# Patient Record
Sex: Female | Born: 1985 | Race: White | Hispanic: No | Smoking: Former smoker
Health system: Southern US, Community
[De-identification: ages and names within clinical notes are randomized; demographics above are authoritative.]

## PROBLEM LIST (undated history)

## (undated) DIAGNOSIS — F329 Major depressive disorder, single episode, unspecified: Secondary | ICD-10-CM

## (undated) DIAGNOSIS — R0609 Other forms of dyspnea: Secondary | ICD-10-CM

## (undated) DIAGNOSIS — Z973 Presence of spectacles and contact lenses: Secondary | ICD-10-CM

## (undated) DIAGNOSIS — O24419 Gestational diabetes mellitus in pregnancy, unspecified control: Secondary | ICD-10-CM

## (undated) DIAGNOSIS — F319 Bipolar disorder, unspecified: Secondary | ICD-10-CM

## (undated) DIAGNOSIS — I441 Atrioventricular block, second degree: Secondary | ICD-10-CM

## (undated) DIAGNOSIS — Z8632 Personal history of gestational diabetes: Secondary | ICD-10-CM

## (undated) DIAGNOSIS — R000 Tachycardia, unspecified: Secondary | ICD-10-CM

## (undated) DIAGNOSIS — Z9889 Other specified postprocedural states: Secondary | ICD-10-CM

## (undated) DIAGNOSIS — D649 Anemia, unspecified: Secondary | ICD-10-CM

## (undated) DIAGNOSIS — N3941 Urge incontinence: Secondary | ICD-10-CM

## (undated) DIAGNOSIS — I10 Essential (primary) hypertension: Secondary | ICD-10-CM

## (undated) DIAGNOSIS — Z972 Presence of dental prosthetic device (complete) (partial): Secondary | ICD-10-CM

## (undated) DIAGNOSIS — N83209 Unspecified ovarian cyst, unspecified side: Secondary | ICD-10-CM

## (undated) DIAGNOSIS — R55 Syncope and collapse: Secondary | ICD-10-CM

## (undated) DIAGNOSIS — F32A Depression, unspecified: Secondary | ICD-10-CM

## (undated) DIAGNOSIS — A630 Anogenital (venereal) warts: Secondary | ICD-10-CM

## (undated) DIAGNOSIS — N189 Chronic kidney disease, unspecified: Secondary | ICD-10-CM

## (undated) DIAGNOSIS — R112 Nausea with vomiting, unspecified: Secondary | ICD-10-CM

## (undated) DIAGNOSIS — F419 Anxiety disorder, unspecified: Secondary | ICD-10-CM

## (undated) DIAGNOSIS — Z8759 Personal history of other complications of pregnancy, childbirth and the puerperium: Secondary | ICD-10-CM

## (undated) DIAGNOSIS — Z87448 Personal history of other diseases of urinary system: Secondary | ICD-10-CM

## (undated) DIAGNOSIS — D500 Iron deficiency anemia secondary to blood loss (chronic): Secondary | ICD-10-CM

## (undated) DIAGNOSIS — I4729 Other ventricular tachycardia: Secondary | ICD-10-CM

## (undated) DIAGNOSIS — N920 Excessive and frequent menstruation with regular cycle: Secondary | ICD-10-CM

## (undated) DIAGNOSIS — F99 Mental disorder, not otherwise specified: Secondary | ICD-10-CM

## (undated) DIAGNOSIS — M1990 Unspecified osteoarthritis, unspecified site: Secondary | ICD-10-CM

## (undated) HISTORY — DX: Essential (primary) hypertension: I10

## (undated) HISTORY — DX: Unspecified osteoarthritis, unspecified site: M19.90

## (undated) HISTORY — PX: WRIST SURGERY: SHX841

## (undated) HISTORY — DX: Anemia, unspecified: D64.9

---
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
## 2004-03-13 ENCOUNTER — Other Ambulatory Visit: Admission: RE | Admit: 2004-03-13 | Discharge: 2004-03-13 | Payer: Self-pay | Admitting: Obstetrics and Gynecology

## 2007-08-03 ENCOUNTER — Emergency Department (HOSPITAL_COMMUNITY): Admission: EM | Admit: 2007-08-03 | Discharge: 2007-08-03 | Payer: Self-pay | Admitting: Emergency Medicine

## 2007-08-28 ENCOUNTER — Encounter: Admission: RE | Admit: 2007-08-28 | Discharge: 2007-08-28 | Payer: Self-pay | Admitting: Orthopedic Surgery

## 2009-07-18 ENCOUNTER — Emergency Department (HOSPITAL_BASED_OUTPATIENT_CLINIC_OR_DEPARTMENT_OTHER): Admission: EM | Admit: 2009-07-18 | Discharge: 2009-07-18 | Payer: Self-pay | Admitting: Emergency Medicine

## 2010-03-08 ENCOUNTER — Inpatient Hospital Stay (HOSPITAL_COMMUNITY): Admission: AD | Admit: 2010-03-08 | Discharge: 2010-03-10 | Payer: Self-pay | Admitting: Obstetrics and Gynecology

## 2010-07-04 LAB — CBC
HCT: 30.6 % — ABNORMAL LOW (ref 36.0–46.0)
HCT: 31.8 % — ABNORMAL LOW (ref 36.0–46.0)
Hemoglobin: 10.4 g/dL — ABNORMAL LOW (ref 12.0–15.0)
Hemoglobin: 10.8 g/dL — ABNORMAL LOW (ref 12.0–15.0)
MCH: 30.3 pg (ref 26.0–34.0)
MCHC: 34 g/dL (ref 30.0–36.0)
MCV: 89 fL (ref 78.0–100.0)
Platelets: 169 10*3/uL (ref 150–400)
RBC: 3.57 MIL/uL — ABNORMAL LOW (ref 3.87–5.11)
RDW: 13 % (ref 11.5–15.5)
RDW: 13.2 % (ref 11.5–15.5)
WBC: 12 10*3/uL — ABNORMAL HIGH (ref 4.0–10.5)
WBC: 8.9 10*3/uL (ref 4.0–10.5)

## 2010-07-04 LAB — RPR: RPR Ser Ql: NONREACTIVE

## 2010-07-17 LAB — DIFFERENTIAL
Basophils Absolute: 0 10*3/uL (ref 0.0–0.1)
Basophils Relative: 0 % (ref 0–1)
Eosinophils Absolute: 0 10*3/uL (ref 0.0–0.7)
Eosinophils Relative: 1 % (ref 0–5)
Lymphs Abs: 1.8 10*3/uL (ref 0.7–4.0)
Neutrophils Relative %: 75 % (ref 43–77)

## 2010-07-17 LAB — BASIC METABOLIC PANEL
BUN: 10 mg/dL (ref 6–23)
Chloride: 103 mEq/L (ref 96–112)
Creatinine, Ser: 0.6 mg/dL (ref 0.4–1.2)
Glucose, Bld: 86 mg/dL (ref 70–99)
Potassium: 4 mEq/L (ref 3.5–5.1)

## 2010-07-17 LAB — CBC
HCT: 40.3 % (ref 36.0–46.0)
MCHC: 34.3 g/dL (ref 30.0–36.0)
MCV: 90.2 fL (ref 78.0–100.0)
Platelets: 240 10*3/uL (ref 150–400)
RDW: 11.7 % (ref 11.5–15.5)
WBC: 9.8 10*3/uL (ref 4.0–10.5)

## 2010-07-17 LAB — URINALYSIS, ROUTINE W REFLEX MICROSCOPIC
Glucose, UA: NEGATIVE mg/dL
Nitrite: NEGATIVE
Protein, ur: NEGATIVE mg/dL
pH: 6 (ref 5.0–8.0)

## 2010-07-17 LAB — URINE MICROSCOPIC-ADD ON

## 2010-07-17 LAB — PREGNANCY, URINE: Preg Test, Ur: POSITIVE

## 2010-07-17 LAB — HCG, QUANTITATIVE, PREGNANCY: hCG, Beta Chain, Quant, S: 18515 m[IU]/mL — ABNORMAL HIGH (ref <5)

## 2010-09-08 NOTE — H&P (Signed)
[facility]  Patient:    BELEM, HINTZE A Visit Number: [id number] MRN: [id number]          Service Type: PSY Location: 10 0104 02 Attending Physician:  Veneta Penton Dictated by:   Veneta Penton, M.D. Admit Date:  01/30/2001                     Psychiatric Admission Assessment  DATE OF ADMISSION:  January 30, 2001.  REASON FOR ADMISSION:  This 25 year old white female was admitted complaining of depression with suicidal ideation with a plan to shoot herself.  HISTORY OF PRESENT ILLNESS:  The patient complains of recurrent episodes of major depression since her brother died 6 years ago.  She and her family have never grieved and have never recovered from this.  The patient reports thinking about suicide "all the time."  She complains of a depressed, irritable, and angry mood most of the day nearly every day, anhedonia, decreased school performance, decreased appetite, weight loss, insomnia, nightmares, feelings of hopelessness, helplessness, worthlessness, decreased concentration and energy level, increased symptoms of fatigue, psychomotor retardation, multiple somatic complaints for which she has undergone multiple medical workups which have been negative.  She also reports significant generalized anxiety.  PAST PSYCHIATRIC HISTORY:  Significant for the recurrent episodes of depression as described as well as a possible conduct disorder characterized by destruction of property and an episode of running away.  She has a longstanding history of oppositional and defiant behavior.  She also has a longstanding drug problem.  She reports smoking at least 3 joints per day of cannabis, using at least 2 beers per day, and smoking cigarettes on a regular basis.  Her polysubstance abuse appears to be much more significant however, and she is refusing to discuss it further.  PAST MEDICAL HISTORY:  Negative for medical or surgical  problems.  DRUG ALLERGIES:  She is allergic to HYDROCODONE and SULFA DRUGS, from which she has had a rash in the past.  She has a history of nausea with NAPROSYN.  CURRENT MEDICATIONS:  She is on no current medications.  FAMILY AND SOCIAL HISTORY:  Mother and father are both depressed, and paternal grandmother has a history of depression.  The patient currently resides with her parents and her 32 year old brother.  She works doing Audiological scientist and has been homeschooled because of an inability to be able to manage in a regular school program for the past 2 to 3 years because of her depression.  STRENGTHS AND ASSETS:  Her parents are supportive of her.  MENTAL STATUS EXAMINATION:  The patient presents as well-developed, well- nourished cachectic white female, who is alert, oriented x 4, psychomotor retarded, whose appearance is compatible with her stated age.  She is disheveled and unkempt.  Her speech is coherent with a decreased rate and volume and speech increased speech latency.  She displays no looseness of associations or evidence of a thought disorder.  Her affect and mood are depressed, irritable, and angry.  Her concentration is decreased.  Her immediate recall, short term memory and remote memory are intact.  Her thought processes are generally goal directed, but sometimes become derailed.  ADMISSION DIAGNOSES: Axis I:    1. Major depression, recurrent type, severe, without psychosis.            2. Polysubstance dependence.            3. Rule out anxiety disorder.  4. Rule out conduct disorder. Axis II:   1. Rule out personality disorder not otherwise specified.            2. Rule out learning disorder not otherwise specified. Axis III:  None. Axis IV:   Severe. Axis V:    Code 20.  FURTHER EVALUATION AND TREATMENT RECOMMENDATIONS:  ESTIMATED LENGTH OF STAY ON THE INPATIENT UNIT:  Five to seven days.  INITIAL DISCHARGE PLAN:  To discharge the patient to  home.  INITIAL PLAN OF CARE:  To begin the patient on a trial of Effexor XR.  Ive discussed with the patient and her parents the risks, benefits, side effects and alternatives, including the alternative of using no medication, and they appear to understand and agree to a trial of Effexor.  A laboratory workup will also be initiated to rule out any other medical problems contributing to her symptomatology.  Psychotherapy will focus on decreasing cognitive distortions, decreasing potential for self harm, as well as confronting her chemical dependency issues.Dictated by:   Veneta Penton, M.D. Attending Physician:  Veneta Penton DD:  01/31/01 TD:  01/31/01 Job: [id number]

## 2010-09-08 NOTE — Op Note (Signed)
[facility]  Patient:    Brandy Hansen, Brandy Hansen                  MRN: [id number] Proc. Date: 10/29/00 Adm. Date:  [id number] Attending:  Ronne Binning                           Operative Report  PREOPERATIVE DIAGNOSES:  Internal derangement, questionable TFCC tear, right wrist.  POSTOPERATIVE DIAGNOSES:  Stretch of scapholunate small dorsal cyst, synovitis, TFCC right wrist.  OPERATION:  Arthroscopy, capsular shrinking, scapholunate shrinkage, debridement of synovitis right wrist.  SURGEON:  Nicki Reaper, M.D.  ASSISTANT:  RN  ANESTHESIA:  General.  ANESTHESIOLOGIST:  Judie Petit, M.D.  HISTORY:  The patient is a 25 year old female who suffered a twisting injury to her wrist with dorsal wrist pain.  This has not responded to conservative treatment.  PROCEDURE:  The patient was brought to the operating room where a general anesthetic was carried out without difficulty.  She was prepped and draped using Betadine scrub and solution with the right arm free in supine position. The lumen was placed in the arthroscopy tower, 10 pounds of traction applied, the joint inflated to a 3-4 portal.  A 3-4 portal was then opened transversely through the skin normally, deepened with a hemostat.  One trocar was used to enter the joint.  The joint was fully inspected and significant laxity of the scapholunate ligament was identified.  A very significant dorsal synovitis was present over the scapholunate area.  A synovitis was present on the ulnar aspect.  An irrigation catheter was placed in ______.  A 4-5 portal was then opened after localization with a 22 gauge needle.  An area of chondromalacia was present on the ulnar aspect of the lunate.  A probe was introduced. Triangular fiber cartilage showed a normal trampoline effect.  There was a very minimal tear of the peripheral aspect of the TFCC.  An Arthrowand was then inserted and a shrinkage  performed.  A debridement of the synovitis was then performed with adequate flow and circulation.  The pre styloid recess was filled with synovial tissue with significant hyperemia.  This area was debrided with the Arthrowand.  The scope was then introduced in 4-5, the ulnar aspect inspected.  The area of chondromalacia was minimally debrided.  The ______ joint was intact.  The Arthrowand was inserted through the 3-4 portal again with adequate irrigation.  A shrinkage of the scapholunate ligament complex was then performed.  The dorsal capsule was then inspected.  A small ganglion cyst was present and this was debrided with the Arthrowand.  The synovitis was removed dorsally and radially.  The mid carpal joint was then inspected through the mid carpal 3-4 distal portal.  The irrigation catheter placed in the distal 4-5 portal.  The joint inspected.  The scapholunate was tight.  The lunate ______ was tight.  There were no cartilage lesions on the capitate, hamate, lunate, ______, or scaphoid.  The ______ joint was intact. The instruments were removed.  The joint proximal row was then injected with Depo-Medrol and Marcaine.  The portals closed with interrupted 5-0 nylon sutures.  Sterile compressive dressing and splint was applied.  Patient tolerated the procedure well and was taken to the recovery room in satisfactory condition.  She is discharged home to return to the [facility] in one week on Talwin and Keflex. DD:  10/29/00  TD:  10/29/00 Job: [id number]

## 2010-09-08 NOTE — Discharge Summary (Signed)
[facility]  Patient:    Brandy Hansen, Brandy Hansen Visit Number: [id number] MRN: [id number]          Service Type: PSY Location: 10 0104 02 Attending Physician:  Veneta Penton Dictated by:   Veneta Penton, M.D. Admit Date:  01/30/2001 Discharge Date: 02/04/2001                             Discharge Summary  REASON FOR ADMISSION:  This 25 year old white female was admitted complaining of depression with suicidal ideation with Hansen plan to shoot herself.  For further history of present illness, please see the patients psychiatric admission assessment.  PHYSICAL EXAMINATION:  At the time of admission was significant only for Hansen history of severe nightmares.  Her history is also significant for cannabis dependence and nicotine dependence.  LABORATORY EXAMINATION:  The patient underwent Hansen laboratory work-up to rule out any medical problems contributing to her symptomatology.  Hansen urine drug screen was positive for metabolites of marijuana.  Hansen urine probe for gonorrhea and chlamydia were negative.  Hansen CBC showed Hansen hematocrit of 35.0 and was otherwise unremarkable.  Hansen basic metabolic panel was within normal limits.  Hansen hepatic panel was within normal limits.  Hansen GGT was within normal limits.  TSH and free T4 was within normal limits.  Urine pregnancy test was negative.  Hansen UA was unremarkable.  An RPR was nonreactive.  The patient received no x-rays, no special procedures, no additional consultations.  She sustained no complications during the course of this hospitalization.  HOSPITAL COURSE:  The patient rapidly adapted well to unit routine, socializing well with both patients and staff.  On admission, she was psychomotor retarded, disheveled, unkempt, depressed, irritable with decreased concentration and was very guarded.  She was confronted on her chemical dependency issues, as well as the fact that her brother died 7 years ago and she and her family  have never addressed this loss.  She began actively working on these issues and her affect and mood is improved.  She was begun on Hansen trial of Effexor XR and has tolerated this medication well without side effects.  At the time of discharge, she denies any homicidal or suicidal ideation.  She is actively working on her 12-step issues to address her chemical dependency problems.  She no longer appears to be Hansen danger to herself or others, denies any homicidal or suicidal ideation, and has shown no assaultive or aggressive behavior while hospitalized.  She has actively participated in all aspects of the therapeutic treatment program and consequently is felt to have reached her maximum benefits of hospitalization and is ready for discharge to Hansen less restricted alternative setting.  CONDITION ON DISCHARGE:  Improved  FINAL DIAGNOSIS: Axis I:    1. Major depression, recurrent type, severe, without psychosis.            2. Polysubstance dependence.            3. Rule out anxiety disorder.            4. Rule out conduct disorder. Axis II:   Rule out personality disorder not otherwise specified. Axis III:  None. Axis IV:   Severe. Axis V:    Code 20 on admission, code 30 on discharge.  FURTHER EVALUATION AND TREATMENT RECOMMENDATIONS: 1. The patient is discharged to home. 2. She is discharged on an unrestricted level of activity and Hansen regular diet. 3.  She will follow up with Dr. Carolanne Grumbling at [facility]    for all further aspects of her psychiatric care and consequently I    will sign off on the case at this time.  DISCHARGE MEDICATIONS:  Effexor XR 37.5 mg p.o. q.Hansen.m. with food for 3 days, then increasing to 75 mg p.o. q.Hansen.m. with food. Dictated by:   Veneta Penton, M.D. Attending Physician:  Veneta Penton DD:  02/04/01 TD:  02/04/01 Job: [id number]

## 2011-04-24 DIAGNOSIS — Z8759 Personal history of other complications of pregnancy, childbirth and the puerperium: Secondary | ICD-10-CM

## 2011-04-24 HISTORY — DX: Personal history of other complications of pregnancy, childbirth and the puerperium: Z87.59

## 2011-06-09 ENCOUNTER — Inpatient Hospital Stay (HOSPITAL_BASED_OUTPATIENT_CLINIC_OR_DEPARTMENT_OTHER)
Admission: EM | Admit: 2011-06-09 | Discharge: 2011-06-09 | Disposition: A | Discharge status: Home / Self Care | Payer: Self-pay | Attending: Obstetrics and Gynecology | Admitting: Obstetrics and Gynecology

## 2011-06-09 ENCOUNTER — Inpatient Hospital Stay (HOSPITAL_COMMUNITY): Payer: Self-pay

## 2011-06-09 ENCOUNTER — Encounter (HOSPITAL_BASED_OUTPATIENT_CLINIC_OR_DEPARTMENT_OTHER): Payer: Self-pay | Admitting: *Deleted

## 2011-06-09 DIAGNOSIS — O200 Threatened abortion: Secondary | ICD-10-CM | POA: Insufficient documentation

## 2011-06-09 DIAGNOSIS — O00109 Unspecified tubal pregnancy without intrauterine pregnancy: Secondary | ICD-10-CM | POA: Insufficient documentation

## 2011-06-09 DIAGNOSIS — O2690 Pregnancy related conditions, unspecified, unspecified trimester: Secondary | ICD-10-CM

## 2011-06-09 DIAGNOSIS — Z09 Encounter for follow-up examination after completed treatment for conditions other than malignant neoplasm: Secondary | ICD-10-CM

## 2011-06-09 LAB — URINALYSIS, ROUTINE W REFLEX MICROSCOPIC
Bilirubin Urine: NEGATIVE
Ketones, ur: NEGATIVE mg/dL
Leukocytes, UA: NEGATIVE
Nitrite: NEGATIVE
Protein, ur: NEGATIVE mg/dL
Urobilinogen, UA: 1 mg/dL (ref 0.0–1.0)
pH: 6.5 (ref 5.0–8.0)

## 2011-06-09 LAB — CBC
Hemoglobin: 12.4 g/dL (ref 12.0–15.0)
MCH: 29.8 pg (ref 26.0–34.0)
MCV: 85.8 fL (ref 78.0–100.0)
Platelets: 261 10*3/uL (ref 150–400)
RBC: 4.16 MIL/uL (ref 3.87–5.11)
WBC: 10.8 10*3/uL — ABNORMAL HIGH (ref 4.0–10.5)

## 2011-06-09 LAB — ABO/RH: ABO/RH(D): O POS

## 2011-06-09 LAB — WET PREP, GENITAL
Trich, Wet Prep: NONE SEEN
Yeast Wet Prep HPF POC: NONE SEEN

## 2011-06-09 LAB — URINE MICROSCOPIC-ADD ON

## 2011-06-09 NOTE — ED Provider Notes (Signed)
History    Scribed for Doug Sou, MD, the patient was seen in room MH07/MH07. This chart was scribed by Katha Cabal.   CSN: [id number]  Arrival date & time 06/09/11  1750   First MD Initiated Contact with Patient 06/09/11 1953      Chief Complaint  Patient presents with  . Vaginal Bleeding    (Consider location/radiation/quality/duration/timing/severity/associated sxs/prior treatment) HPI Brandy Hansen is a 26 y.o. female who presents to the Emergency Department complaining of persistent mild vaginal bleeding that began today.  Episode associated with abdominal cramping.  Episode has not changed.  Patient took home pregnancy test and [employer] confirmed pregnancy 3-4 days ago.  Patient is [redacted] weeks pregnant.  Patient reports prior pregnancy without complications.  There are no chronic health problems.  Patient taking prenatal vitamins.  Patient's last menstrual period was 05/01/2011.  History reviewed. No pertinent past medical history.  History reviewed. No pertinent past surgical history.  History reviewed. No pertinent family history.  History  Substance Use Topics  . Smoking status: Former Games developer  . Smokeless tobacco: Not on file  . Alcohol Use: No  Patient quit smoking 4 days ago.  No illicit drug use.  Patient drinks occasionally.    OB History    Grav Para Term Preterm Abortions TAB SAB Ect Mult Living   2 1              Review of Systems  Constitutional: Negative.   HENT: Negative.   Respiratory: Negative.   Cardiovascular: Negative.   Gastrointestinal: Negative.   Genitourinary: Positive for vaginal bleeding.  Musculoskeletal: Negative.   Skin: Negative.   Neurological: Negative.   Hematological: Negative.   Psychiatric/Behavioral: Negative.     Allergies  Codeine; Naproxen; and Sulfa antibiotics  Home Medications   Current Outpatient Rx  Name Route Sig Dispense Refill  . PRENATAL MULTIVITAMIN CH Oral Take 1 tablet by  mouth daily.      BP 136/79  Pulse 96  Temp(Src) 98.3 F (36.8 C) (Oral)  Resp 20  Ht 5\' 6"  (1.676 m)  Wt 131 lb (59.421 kg)  BMI 21.14 kg/m2  SpO2 99%  LMP 05/01/2011  Physical Exam  Nursing note and vitals reviewed. Constitutional: She appears well-developed and well-nourished.  HENT:  Head: Normocephalic and atraumatic.  Eyes: Conjunctivae are normal. Pupils are equal, round, and reactive to light.  Neck: Neck supple. No tracheal deviation present. No thyromegaly present.  Cardiovascular: Normal rate and regular rhythm.   No murmur heard. Pulmonary/Chest: Effort normal and breath sounds normal.  Abdominal: Soft. Bowel sounds are normal. She exhibits no distension. There is no tenderness.  Genitourinary:       Cervical os closed, no external lesion, minimal blood in vaginal vault, right adnexa tenderness no masses, no cervical motion tenderness   Musculoskeletal: Normal range of motion. She exhibits no edema and no tenderness.  Neurological: She is alert. Coordination normal.  Skin: Skin is warm and dry. No rash noted.  Psychiatric: She has a normal mood and affect.    ED Course  Procedures (including critical care time)   DIAGNOSTIC STUDIES: Oxygen Saturation is 99% on room air, normal by my interpretation.     COORDINATION OF CARE: 8:07 Pm  Physical and pelvic exam complete.     LABS / RADIOLOGY:   Labs Reviewed  URINALYSIS, ROUTINE W REFLEX MICROSCOPIC - Abnormal; Notable for the following:    Hgb urine dipstick LARGE (*) VERIFIED   All other  components within normal limits  URINE MICROSCOPIC-ADD ON - Abnormal; Notable for the following:    Bacteria, UA FEW (*)    All other components within normal limits  PREGNANCY, URINE - Abnormal; Notable for the following:    Preg Test, Ur POSITIVE (*)    All other components within normal limits  TYPE AND SCREEN  CBC  GC/CHLAMYDIA PROBE AMP, GENITAL  RPR  WET PREP, GENITAL   No results found.   No diagnosis  found.    MDM  Differential diagnosis includes threatened abortion versus ectopic pregnancy. We do not have ultrasound available to [place] presently Spoke with Dr. Judie Petit. Dareen Piano who will accept patient in transfer to the maternity admissions unit at [facility] hospital Diagnosis complication of pregnancy    I personally performed the services described in this documentation, which was scribed in my presence. The recorded information has been reviewed and considered.    Doug Sou, MD 06/09/11 2014

## 2011-06-09 NOTE — ED Notes (Signed)
Pt states she is [redacted] weeks pregnant and just noticed that she was spotting and having some abd cramping. Tearful.

## 2011-06-09 NOTE — ED Notes (Signed)
I placed a call for consult to [facility] per Dr. Ethelda Chick. The call was returned by Dr. Malva Limes @ 2007.

## 2011-06-10 LAB — RPR: RPR Ser Ql: NONREACTIVE

## 2011-06-12 ENCOUNTER — Inpatient Hospital Stay (HOSPITAL_COMMUNITY)
Admission: AD | Admit: 2011-06-12 | Discharge: 2011-06-12 | Disposition: A | Discharge status: Home / Self Care | Payer: Self-pay | Source: Ambulatory Visit | Attending: Obstetrics & Gynecology | Admitting: Obstetrics & Gynecology

## 2011-06-12 ENCOUNTER — Encounter (HOSPITAL_COMMUNITY): Payer: Self-pay

## 2011-06-12 DIAGNOSIS — O039 Complete or unspecified spontaneous abortion without complication: Secondary | ICD-10-CM

## 2011-06-12 DIAGNOSIS — O209 Hemorrhage in early pregnancy, unspecified: Secondary | ICD-10-CM | POA: Insufficient documentation

## 2011-06-12 DIAGNOSIS — Z09 Encounter for follow-up examination after completed treatment for conditions other than malignant neoplasm: Secondary | ICD-10-CM

## 2011-06-12 LAB — HCG, QUANTITATIVE, PREGNANCY: hCG, Beta Chain, Quant, S: 444 m[IU]/mL — ABNORMAL HIGH (ref <5)

## 2011-06-12 NOTE — ED Provider Notes (Signed)
Brandy Hansen is a 26 y.o. female who presents to MAU for follow up Bhcg. She was evaluated at [facility] 06/09/11 and the Bhcg at that time was 233. Her blood type is O positive. Ultrasound showed no IUP or adnexal mass. Today she reports that she has no pain but continues to have bleeding. Results for orders placed during the hospital encounter of 06/12/11 (from the past 24 hour(s))  HCG, QUANTITATIVE, PREGNANCY     Status: Abnormal   Collection Time   06/12/11  9:35 AM      Component Value Range   hCG, Beta Chain, Quant, S 444 (*) <5 (mIU/mL)   Will have patient return in 48 hours to repeat the bhcg and if doubles will schedule for follow up ultrasound. [place], NP 06/12/11 1041  [place], [place] 06/12/11 1042

## 2011-06-12 NOTE — Progress Notes (Signed)
Patient to MAU for repeat BHCG. Patient denies any pain but does continue to bleed. Mostly small amounts but has episodes of heavier bleeding.

## 2011-06-12 NOTE — Discharge Instructions (Signed)
RETURN HERE IN 2 DAYS TO REPEAT THE PREGNANCY HORMONE LEVEL. RETURN SOONER FOR ANY PROBLEMS. NO SEX, NO TAMPONS, NOTHING IN THE VAGINA UNTIL FOLLOW UP.

## 2011-06-14 ENCOUNTER — Encounter (HOSPITAL_COMMUNITY): Payer: Self-pay

## 2011-06-14 ENCOUNTER — Inpatient Hospital Stay (HOSPITAL_COMMUNITY)
Admission: AD | Admit: 2011-06-14 | Discharge: 2011-06-14 | Disposition: A | Discharge status: Home / Self Care | Payer: Self-pay | Source: Ambulatory Visit | Attending: Obstetrics & Gynecology | Admitting: Obstetrics & Gynecology

## 2011-06-14 DIAGNOSIS — O039 Complete or unspecified spontaneous abortion without complication: Secondary | ICD-10-CM | POA: Insufficient documentation

## 2011-06-14 HISTORY — DX: Major depressive disorder, single episode, unspecified: F32.9

## 2011-06-14 HISTORY — DX: Depression, unspecified: F32.A

## 2011-06-14 HISTORY — DX: Anxiety disorder, unspecified: F41.9

## 2011-06-14 HISTORY — DX: Mental disorder, not otherwise specified: F99

## 2011-06-14 HISTORY — DX: Bipolar disorder, unspecified: F31.9

## 2011-06-14 LAB — HCG, QUANTITATIVE, PREGNANCY: hCG, Beta Chain, Quant, S: 464 m[IU]/mL — ABNORMAL HIGH (ref <5)

## 2011-06-14 NOTE — Progress Notes (Signed)
Pt here for f/u only, denies pain or bleeding. ERice, PA in triage room with pt.

## 2011-06-14 NOTE — ED Provider Notes (Signed)
History   Pt presents today for repeat B-quant secondary to vag bleeding in preg. She states that her bleeding and pain has now completely resolved. She denies any pain at this time.  Chief Complaint  Patient presents with  . Follow-up   HPI  OB History    Grav Para Term Preterm Abortions TAB SAB Ect Mult Living   2 1              No past medical history on file.  Past Surgical History  Procedure Date  . No past surgeries     Family History  Problem Relation Age of Onset  . Anesthesia problems Neg Hx     History  Substance Use Topics  . Smoking status: Former Games developer  . Smokeless tobacco: Never Used  . Alcohol Use: No    Allergies:  Allergies  Allergen Reactions  . Codeine Itching  . Naproxen Nausea And Vomiting and Rash  . Sulfa Antibiotics Rash    Prescriptions prior to admission  Medication Sig Dispense Refill  . Prenatal Vit-Fe Fumarate-FA (PRENATAL MULTIVITAMIN) TABS Take 1 tablet by mouth daily.        Review of Systems  Constitutional: Negative for fever and chills.  Eyes: Negative for blurred vision and double vision.  Respiratory: Negative for cough, hemoptysis, sputum production, shortness of breath and wheezing.   Cardiovascular: Negative for chest pain and palpitations.  Gastrointestinal: Negative for nausea, vomiting, abdominal pain, diarrhea and constipation.  Genitourinary: Negative for dysuria, urgency, frequency and hematuria.  Neurological: Negative for dizziness and headaches.  Psychiatric/Behavioral: Negative for depression and suicidal ideas.   Physical Exam   Height 5\' 6"  (1.676 m), weight 131 lb 6 oz (59.591 kg), last menstrual period 05/01/2011.  Physical Exam  Nursing note and vitals reviewed. Constitutional: She is oriented to person, place, and time. She appears well-developed and well-nourished. No distress.  HENT:  Head: Normocephalic and atraumatic.  Eyes: EOM are normal. Pupils are equal, round, and reactive to light.    GI: Soft. She exhibits no distension and no mass. There is no tenderness. There is no rebound and no guarding.  Neurological: She is alert and oriented to person, place, and time.  Skin: Skin is warm and dry. She is not diaphoretic.  Psychiatric: She has a normal mood and affect. Her behavior is normal. Judgment and thought content normal.    MAU Course  Procedures  Results for orders placed during the hospital encounter of 06/14/11 (from the past 72 hour(s))  HCG, QUANTITATIVE, PREGNANCY     Status: Abnormal   Collection Time   06/14/11  8:29 AM      Component Value Range Comment   hCG, Beta Chain, Quant, S 464 (*) <5 (mIU/mL)      Assessment and Plan  Complete AB: discussed with pt at length. Her B-quant only went up by 20 MIU/mL. Discussed with pt at length. Will have her return in 2 days to make sure B-quant is on decline. If still increasing then will need to repeat US. Discussed ectopic precautions with pt at length. Discussed diet, activity, risks, and precautions.  Clinton Gallant. Simrin Vegh III, DrHSc, MPAS, PA-C  06/14/2011, 9:29 AM   Henrietta Hoover, PA 06/14/11 [phone]

## 2011-06-16 ENCOUNTER — Inpatient Hospital Stay (HOSPITAL_COMMUNITY)
Admission: AD | Admit: 2011-06-16 | Discharge: 2011-06-16 | Disposition: A | Discharge status: Home / Self Care | Payer: Self-pay | Source: Ambulatory Visit | Attending: Obstetrics and Gynecology | Admitting: Obstetrics and Gynecology

## 2011-06-16 DIAGNOSIS — Z09 Encounter for follow-up examination after completed treatment for conditions other than malignant neoplasm: Secondary | ICD-10-CM

## 2011-06-16 DIAGNOSIS — O99891 Other specified diseases and conditions complicating pregnancy: Secondary | ICD-10-CM | POA: Insufficient documentation

## 2011-06-16 NOTE — Progress Notes (Signed)
Patient reports had significant cramping yesterday not as bad today has small amount of blood yesterday with small clot.

## 2011-06-16 NOTE — ED Provider Notes (Signed)
Brandy Hansen is a 26 y.o. female who presents to MAU for follow up bhcg. Initial visit was 2/16 and Bhcg was 233, then 2/19 = 444, and 2/21 =446. Today has increased to 700. Blood type is O positive.  Results for orders placed during the hospital encounter of 06/16/11 (from the past 24 hour(s))  HCG, QUANTITATIVE, PREGNANCY     Status: Abnormal   Collection Time   06/16/11  9:21 AM      Component Value Range   hCG, Beta Chain, Quant, S 700 (*) <5 (mIU/mL)   Discussed with Dr. Jolayne Panther. We will have the patient to return in 48 hours for follow up Bhcg and she is to follow ectopic precautions. If the number increases we will schedule for follow up ultrasound.  [place], [place] 06/16/11 1035

## 2011-06-16 NOTE — Discharge Instructions (Signed)
YOUR PREGNANCY HORMONE LEVEL HAS INCREASED TODAY FROM 464 TO 700. WE WILL NEED FOR YOU TO RETURN IN 2 DAYS TO REPEAT THE LEVEL. IF IT CONTINUES TO RISE WE WILL SCHEDULE A FOLLOW UP ULTRASOUND. NO SEX, NO TAMPONS, NOTHING IN THE VAGINA UNTIL FOLLOW UP. RETURN IMMEDIATELY FOR ANY PROBLEMS.

## 2011-06-18 ENCOUNTER — Inpatient Hospital Stay (HOSPITAL_COMMUNITY)
Admission: AD | Admit: 2011-06-18 | Discharge: 2011-06-18 | Disposition: A | Discharge status: Home / Self Care | Payer: Self-pay | Source: Ambulatory Visit | Attending: Obstetrics & Gynecology | Admitting: Obstetrics & Gynecology

## 2011-06-18 ENCOUNTER — Inpatient Hospital Stay (HOSPITAL_COMMUNITY): Payer: Self-pay

## 2011-06-18 ENCOUNTER — Encounter (HOSPITAL_COMMUNITY): Payer: Self-pay | Admitting: *Deleted

## 2011-06-18 DIAGNOSIS — R1031 Right lower quadrant pain: Secondary | ICD-10-CM | POA: Insufficient documentation

## 2011-06-18 DIAGNOSIS — O00109 Unspecified tubal pregnancy without intrauterine pregnancy: Secondary | ICD-10-CM | POA: Insufficient documentation

## 2011-06-18 DIAGNOSIS — O009 Unspecified ectopic pregnancy without intrauterine pregnancy: Secondary | ICD-10-CM

## 2011-06-18 HISTORY — DX: Nausea with vomiting, unspecified: R11.2

## 2011-06-18 HISTORY — DX: Nausea with vomiting, unspecified: Z98.890

## 2011-06-18 HISTORY — DX: Unspecified ovarian cyst, unspecified side: N83.209

## 2011-06-18 HISTORY — DX: Chronic kidney disease, unspecified: N18.9

## 2011-06-18 HISTORY — DX: Anogenital (venereal) warts: A63.0

## 2011-06-18 LAB — COMPREHENSIVE METABOLIC PANEL
AST: 12 U/L (ref 0–37)
BUN: 9 mg/dL (ref 6–23)
CO2: 25 mEq/L (ref 19–32)
Calcium: 9.2 mg/dL (ref 8.4–10.5)
Chloride: 101 mEq/L (ref 96–112)
Creatinine, Ser: 0.55 mg/dL (ref 0.50–1.10)
GFR calc Af Amer: >90 mL/min (ref >90)
GFR calc non Af Amer: >90 mL/min (ref >90)
Total Bilirubin: 0.3 mg/dL (ref 0.3–1.2)

## 2011-06-18 LAB — CBC
HCT: 35.9 % — ABNORMAL LOW (ref 36.0–46.0)
MCH: 29.1 pg (ref 26.0–34.0)
MCV: 90 fL (ref 78.0–100.0)
Platelets: 263 10*3/uL (ref 150–400)
RBC: 3.99 MIL/uL (ref 3.87–5.11)
WBC: 6 10*3/uL (ref 4.0–10.5)

## 2011-06-18 MED ORDER — METHOTREXATE INJECTION FOR WOMEN'S HOSPITAL
50.0000 mg/m2 | Freq: Once | INTRAMUSCULAR | Status: AC
  Administered 2011-06-18: 85 mg via INTRAMUSCULAR
  Filled 2011-06-18: qty 1.7

## 2011-06-18 NOTE — Progress Notes (Signed)
Woke up feeling nauseated.  occ spotting reddish/brown. No pain.

## 2011-06-18 NOTE — Discharge Instructions (Signed)
Ectopic Pregnancy, Treatment with Methotrexate Ectopic pregnancy is a pregnancy located outside of the uterus (womb). An ectopic pregnancy is usually located in the fallopian tube, but rarely it can be located on the ovary in the pelvis, or on the intestine in the abdomen. Immediate treatment is necessary, because if it tears (ruptures), it can cause severe bleeding (hemorrhage). Ectopic pregnancy occurs in 1 out of 50 pregnancies. It is the leading cause of death in the first 3 months of pregnancy.  If the ectopic pregnancy is found early enough, it can be treated with medicine. This medicine is called methotrexate. If the pregnancy is 1  inches (3.5 cm) or larger, or it ruptures, surgery to remove the pregnancy becomes necessary, and methotrexate should not be used. CAUSES   Blocked tubes from scarring, caused by a pelvic infection.   Other risk factors include:   Previous ectopic pregnancy.   Surgery on the fallopian tube.   DES (estrogen hormone) given to the mother during her pregnancy.   In vitro fertilization, with stimulating the ovaries to produce eggs.   Older women (over age 35) getting pregnant.   Women who smoke.   Previous pelvic or abdominal surgery.   Blocked fallopian tubes.   Reversing a previous tubal ligation (female sterilization, "tied tubes").   Endometriosis (uterus tissue growing outside the uterus) causing scar tissue in and around the tubes.   Intrauterine device (IUD) contraception (rare).   Sexually transmitted disease (STD).   Often, there is no apparent cause.  SYMPTOMS   Pregnancy symptoms:   Nausea.   Vomiting.   Tiredness.   Feeling of fullness in the abdomen.   Missed menstrual period.   Breast tenderness.   Frequent urination.   Abdominal discomfort or pain.   Vaginal spotting or bleeding.   Pain with intercourse.  Symptoms of a bleeding or ruptured ectopic pregnancy:  All of the above symptoms.   Shoulder pain, from  blood between the liver and diaphragm (muscle below the lungs).   Fast heartbeat.   Low blood pressure.   Dizziness or lightheadedness.   Passing out.   Severe or sudden abdominal pain, while having a bowel movement.  DIAGNOSIS   Pregnancy test, to confirm the diagnosis.   Pelvic examination.   Ultrasound of the pelvis.   CT scan, may be necessary.   Blood tests to check the pregnancy hormone level changes over several days.   Progesterone blood level changes, taken over several days.   Laparoscopic surgery (examining inside the abdomen, with a lighted tube), if the above tests cannot confirm the diagnosis.   Abdominal exploratory surgery, may be necessary.  TREATMENT  Methotrexate works by stopping the pregnancy from growing. It helps the body absorb the pregnancy tissue over a 4 to 6 week period. This preserves the fallopian tube. Blood tests will be taken for several weeks, to check the pregnancy hormone levels, until there is no more pregnancy hormone detected in the blood. The success rate is between 84% and 90%. There is still a risk of the ectopic pregnancy rupturing while using the methotrexate.  Methotrexate 75 mg, given in 1 dose in the muscle. Sometimes, it may take more than 1 dose.  Methotrexate should not be given in women who:   Are breast-feeding.   Have liver disease.   Have blood problems.   Are allergic to methotrexate.   Have lung disease.   Have kidney disease.   Have peptic ulcer.   Have an ectopic pregnancy larger   than 1  inches (3.5 cm).   Have cardiac motion (fetal heartbeat) seen in the pregnancy.  Before giving the medications:  Liver, kidney, and bone marrow blood tests are done.   Blood tests to measure the pregnancy hormone levels and blood type are done.   If the woman is Rh negative, and the father is Rh positive or his Rh type is not known, Rh immune globulin (RhoGAM) is given.  SIDE EFFECTS OF METHOTREXATE  Nausea and  vomiting.   Mouth sores.   Diarrhea.   Rash.   Dizziness.   Increased abdominal pain.   Low white blood cell count (rare).   Increased vaginal bleeding or spotting.   Pneumonia.   Hair loss (rare, and reversible).   Failed treatment.  On very rare occasions, the medication may affect your blood counts, liver, kidney, bone marrow, or hormone levels. If this happens, your caregiver will want to perform more blood tests. HOME CARE INSTRUCTIONS   Do not have sexual intercourse until your caregiver says it is safe to do so.   You may resume your usual diet and regular activities.   Do not take folic acid or vitamins that contain folic acid.   Do not take aspirin, ibuprofen, or naproxen (NSAIDs).   Do not drink alcoholic beverages.   Return to the emergency room or your caregiver's office for blood work as directed, to be sure the pregnancy hormone is no longer present. This is usually done in 4 to 7 days, and then at weekly visits until there is no pregnancy hormone detected.   It is very important to return for your blood work as instructed, to make sure the pregnancy is not progressing.  AN ECTOPIC PREGNANCY IS A SERIOUS THREAT TO YOUR LIFE, SO INSTRUCTIONS MUST BE FOLLOWED. You and your spouse or partner may develop emotional problems over the loss of your pregnancy. If so:  Grieving helps.   Counseling often helps.   Let your body and mind heal before trying to get pregnant again.   Talk to your caregiver for advice about when to try to get pregnant again.  SEEK MEDICAL CARE IF:   You cannot control your nausea and vomiting.   You cannot control your diarrhea.   You need treatment for the sores in your mouth.   You need pain medicine for your abdominal pain.   You develop a rash.   You are having a reaction to the medicine.  SEEK IMMEDIATE MEDICAL CARE IF:   You experience increasing abdominal or pelvic pain.   You notice increased bleeding.   You feel  lightheaded or you faint (pass out).   You develop shortness of breath.   You develop an increase in your heart rate.   You develop a cough.   You develop chills.   You have an oral temperature above 102 F (38.9 C), not controlled by medicine.  Document Released: 04/03/2001 Document Revised: 10/23/2010 Document Reviewed: 04/12/2009 [employer] Patient Information 2012 [employer]. 

## 2011-06-18 NOTE — ED Provider Notes (Signed)
History   Pt presents today for repeat B-quant secondary to pain and vag bleeding. She has had serial B-quant levels. She states she continues to have mild RLQ pain and bleeding. She denies any new sx including fever.  Chief Complaint  Patient presents with  . Follow-up   HPI  OB History    Grav Para Term Preterm Abortions TAB SAB Ect Mult Living   2 1 1       1       Past Medical History  Diagnosis Date  . Mental disorder   . Depression   . Anxiety   . Bipolar 1 disorder   . PONV (postoperative nausea and vomiting)   . Chronic kidney disease     ? problems as child, "stretched" fine since  . Ovarian cyst   . HPV (human papilloma virus) anogenital infection     Past Surgical History  Procedure Date  . Wrist surgery     Family History  Problem Relation Age of Onset  . Anesthesia problems Neg Hx   . Diabetes Mother   . Hypertension Father     History  Substance Use Topics  . Smoking status: Former Smoker    Types: Cigarettes  . Smokeless tobacco: Never Used  . Alcohol Use: No    Allergies:  Allergies  Allergen Reactions  . Codeine Itching  . Naproxen Nausea And Vomiting and Rash  . Sulfa Antibiotics Rash    Prescriptions prior to admission  Medication Sig Dispense Refill  . Prenatal Vit-Fe Fumarate-FA (PRENATAL MULTIVITAMIN) TABS Take 1 tablet by mouth daily.        Review of Systems  Constitutional: Negative for fever and chills.  Eyes: Negative for blurred vision and double vision.  Respiratory: Negative for cough, hemoptysis, sputum production, shortness of breath and wheezing.   Cardiovascular: Negative for chest pain and palpitations.  Gastrointestinal: Positive for abdominal pain. Negative for nausea, vomiting, diarrhea and constipation.  Genitourinary: Negative for dysuria, urgency, frequency and hematuria.  Neurological: Negative for dizziness and headaches.  Psychiatric/Behavioral: Negative for depression and suicidal ideas.   Physical Exam    Blood pressure 117/68, pulse 93, temperature 98.4 F (36.9 C), temperature source Oral, resp. rate 18, height 5\' 6"  (1.676 m), weight 131 lb 9.6 oz (59.693 kg), last menstrual period 05/01/2011.  Physical Exam  Nursing note and vitals reviewed. Constitutional: She is oriented to person, place, and time. She appears well-developed and well-nourished. No distress.  HENT:  Head: Normocephalic and atraumatic.  Eyes: EOM are normal. Pupils are equal, round, and reactive to light.  GI: Soft. She exhibits no distension. There is tenderness. There is no rebound and no guarding.  Neurological: She is alert and oriented to person, place, and time.  Skin: Skin is warm and dry. She is not diaphoretic.  Psychiatric: She has a normal mood and affect. Her behavior is normal. Judgment and thought content normal.    MAU Course  Procedures  Results for orders placed during the hospital encounter of 06/18/11 (from the past 72 hour(s))  HCG, QUANTITATIVE, PREGNANCY     Status: Abnormal   Collection Time   06/18/11  9:00 AM      Component Value Range Comment   hCG, Beta Chain, Quant, S 869 (*) <5 (mIU/mL)   CBC     Status: Abnormal   Collection Time   06/18/11 11:56 AM      Component Value Range Comment   WBC 6.0  4.0 - 10.5 (K/uL)  RBC 3.99  3.87 - 5.11 (MIL/uL)    Hemoglobin 11.6 (*) 12.0 - 15.0 (g/dL)    HCT 11.9 (*) 14.7 - 46.0 (%)    MCV 90.0  78.0 - 100.0 (fL)    MCH 29.1  26.0 - 34.0 (pg)    MCHC 32.3  30.0 - 36.0 (g/dL)    RDW 82.9  56.2 - 13.0 (%)    Platelets 263  150 - 400 (K/uL)   COMPREHENSIVE METABOLIC PANEL     Status: Normal   Collection Time   06/18/11 11:56 AM      Component Value Range Comment   Sodium 135  135 - 145 (mEq/L)    Potassium 4.3  3.5 - 5.1 (mEq/L)    Chloride 101  96 - 112 (mEq/L)    CO2 25  19 - 32 (mEq/L)    Glucose, Bld 95  70 - 99 (mg/dL)    BUN 9  6 - 23 (mg/dL)    Creatinine, Ser 8.65  0.50 - 1.10 (mg/dL)    Calcium 9.2  8.4 - 10.5 (mg/dL)    Total  Protein 6.7  6.0 - 8.3 (g/dL)    Albumin 4.1  3.5 - 5.2 (g/dL)    AST 12  0 - 37 (U/L)    ALT 10  0 - 35 (U/L)    Alkaline Phosphatase 45  39 - 117 (U/L)    Total Bilirubin 0.3  0.3 - 1.2 (mg/dL)    GFR calc non Af Amer >90  >90 (mL/min)    GFR calc Af Amer >90  >90 (mL/min)      US shows 2.4cm Rt ectopic preg.  Discussed pt with Dr. Macon Large. Will offer methotrexate.  Assessment and Plan  Ectopic preg: discussed with pt at length. Discussed tx options including methotrexate vs surgical correction. She desires methotrexate. This will be given today and she will return on 06/21/11 for repeat B-quant. Discussed signs and sx of ruptured ectopic preg with pt at length. She understood and agreed. Discussed diet, activity, risks, and precautions.  Clinton Gallant. Emon Lance III, DrHSc, MPAS, PA-C  06/18/2011, 11:54 AM   Henrietta Hoover, PA 06/18/11 1239

## 2011-06-18 NOTE — ED Provider Notes (Signed)
Agree with above note.  Brandy Hansen 06/18/2011 11:53 AM    

## 2011-06-19 NOTE — ED Provider Notes (Signed)
Attestation of Attending Supervision of Advanced Practitioner: Evaluation and management procedures were performed by the PA/NP/CNM/OB Fellow under my supervision/collaboration. Chart reviewed, and agree with management and plan.  Ramona Ruark, M.D. 06/19/2011 9:08 AM   

## 2011-06-21 ENCOUNTER — Inpatient Hospital Stay (HOSPITAL_COMMUNITY)
Admission: AD | Admit: 2011-06-21 | Discharge: 2011-06-21 | Disposition: A | Discharge status: Home / Self Care | Payer: Self-pay | Source: Ambulatory Visit | Attending: Obstetrics & Gynecology | Admitting: Obstetrics & Gynecology

## 2011-06-21 DIAGNOSIS — O009 Unspecified ectopic pregnancy without intrauterine pregnancy: Secondary | ICD-10-CM

## 2011-06-21 DIAGNOSIS — O00109 Unspecified tubal pregnancy without intrauterine pregnancy: Secondary | ICD-10-CM | POA: Insufficient documentation

## 2011-06-21 LAB — HCG, QUANTITATIVE, PREGNANCY: hCG, Beta Chain, Quant, S: 1109 m[IU]/mL — ABNORMAL HIGH (ref <5)

## 2011-06-21 LAB — CBC
MCV: 89.1 fL (ref 78.0–100.0)
Platelets: 225 10*3/uL (ref 150–400)
RBC: 3.85 MIL/uL — ABNORMAL LOW (ref 3.87–5.11)
RDW: 13.2 % (ref 11.5–15.5)
WBC: 7 10*3/uL (ref 4.0–10.5)

## 2011-06-21 MED ORDER — ONDANSETRON 8 MG PO TBDP: 8.0000 mg | ORAL_TABLET | Freq: Three times a day (TID) | ORAL | Status: AC | PRN

## 2011-06-21 MED ORDER — ONDANSETRON 8 MG PO TBDP: 8.0000 mg | ORAL_TABLET | Freq: Once | ORAL | Status: DC

## 2011-06-21 NOTE — Progress Notes (Signed)
Patient to MAU for day 4 BHCG s/p MTX. Reports heavy bleeding and passing several clots with one last night that was large with gray tissue. Patient states she has had severe nausea and vomiting last night. Nausea continues today. Last night felt like she was going to pass out. Moderate lower abdominal pain.

## 2011-06-21 NOTE — ED Provider Notes (Signed)
Brandy Hansen is a 26 y.o. female who presents to MAU for follow up Bhcg day 4,  s/p MTX . On her last visit the Bchg was 869 and today is 1109. Patient having nausea and vomiting today and pain in lower abdomen right > left.  Discussed with Dr. Penne Lash and she will be in to examine the patient.  [place], NP 06/21/11 1011  Pt seen and examined.  Pt states pain is no worse than it has been since the beginning of the ectopic.  She is nauseated, but she is nauseated often in her life.  She would like a prescription of Zofran.  Pt denies lightheadedness or dizziness.  Pt denies severe abdominal pain.  VSS Abd:  Soft, NT, ND, no rebound, no guarding.  CBC    Component Value Date/Time   WBC 7.0 06/21/2011 0927   RBC 3.85* 06/21/2011 0927   HGB 11.3* 06/21/2011 0927   HCT 34.3* 06/21/2011 0927   PLT 225 06/21/2011 0927   MCV 89.1 06/21/2011 0927   MCH 29.4 06/21/2011 0927   MCHC 32.9 06/21/2011 0927   RDW 13.2 06/21/2011 0927   LYMPHSABS 1.8 07/18/2009 2123   MONOABS 0.6 07/18/2009 2123   EOSABS 0.0 07/18/2009 2123   BASOSABS 0.0 07/18/2009 2123    Hgb stable from last visit.  A/P:  26 yo female with right ectopic pregnancy s/p MTX.  Pt has no signs of rupture.  Will treat nausea.  Pt given strict ectopic precautions.  Pt to return with increased abdominal pain.

## 2011-06-21 NOTE — Discharge Instructions (Signed)
RETURN IN 3 DAYS to MAU for blood draw  Ectopic Pregnancy Ectopic pregnancy means the fertilized egg attached (implanted) outside the uterus. This occurs in about 1 out of 50 pregnancies. Many ectopic pregnancies occur in the fallopian tube (tube between the uterus and ovary). Ectopic pregnancies rarely occur on the ovary, intestine, pelvis, or cervix. It is the biggest cause of women dying in the first 12 weeks of their pregnancy. Death is a result of fallopian tube tearing (rupture) with severe internal bleeding. A tubal pregnancy does not have the room or blood supply to develop normally. As it grows, it stretches the walls of the tube. This causes pain. If an ectopic pregnancy ruptures through the tube, serious internal bleeding can occur, along with severe pain, fainting, and shock. This is a surgical emergency. CAUSES   Previous ectopic pregnancy.   Pelvic infection (PID, pelvic inflammatory disease).   Previous pelvic or abdominal surgery.   Uterus lining tissue growing outside the uterus (endometriosis).   Using an intrauterine device (IUD) for birth control (rare).   DES (estrogen drug) exposure when your mother was pregnant with you.   Pregnancy in women over 51 years old.   Smoking.   Infertility treatment, stimulating the ovaries to produce eggs for in vitro fertilization.   Previous fallopian tube surgery, such as reversing a tubal ligation (female sterilization, "tied tubes").   Blocked tubes.  Many times, there is no apparent cause. SYMPTOMS   The usual pregnancy symptoms:   Nausea.   Tiredness.   Breast tenderness.   Pain with intercourse.   Irregular vaginal bleeding or spotting.   Cramping or pain on one side, or in the lower abdomen.   Fast heartbeat.   Passing out while having a bowel movement.   If the tube ruptures and you develop internal bleeding, there will likely be:   Sudden, severe pain in the abdomen and pelvis.   Dizziness or  fainting.   Pain in the shoulder area (sometimes).  DIAGNOSIS   Diagnosis is made first by confirming the pregnancy with a pregnancy test.   Ultrasound.   Testing levels of pregnancy hormones and changing of the hormone levels in the blood.   Taking a sample of uterus tissue (D&C, dilation and curettage).   Visual exam inside the abdomen, using a lighted tube (laparoscopy).   Rarely, CT scan and MRI are needed.  TREATMENT   An injection of methotrexate medicine. This is given if:   The diagnosis is made early, and the pregnancy in the tube is 3.5 centimeters or less in size.   The tube has not ruptured.   Usually, pregnancy hormone blood levels are followed after this treatment, to be sure there is no pregnancy tissue left.   It takes 4 to 6 weeks for the pregnancy to be absorbed.   Surgical treatment, using a lighted tube (laparoscope), to remove the tubal pregnancy.   If severe internal bleeding occurs, a cut (incision) in the lower abdomen (laparotomy) is made, to remove the tubal pregnancy and to stop bleeding. This is a surgical emergency.   The area of the tube with the pregnancy may be removed. Sometimes, the whole tube must be removed.   After surgery, pregnancy hormone tests may be done to be sure there is no pregnancy tissue left.   If the mother has Rh-negative blood and the father is Rh-positive, you will need a RhoGAM shot to prevent Rh problems with future pregnancies.   You may need a  blood transfusion (donated blood).   You may be put on an antibiotic medicine.  HOME CARE INSTRUCTIONS   Get plenty of rest.   Avoid sexual intercourse until your caregiver says it is OK.   Avoid lifting more than 5 pounds.   Eat a well balanced diet.   Do not drink alcoholic drinks, especially while taking pain medicine.   Take all the medicines your caregiver gives you, including iron pills if you lost a lot of blood.   Do not take aspirin, because it can cause  bleeding.   If you had abdominal surgery, change your dressing as instructed.   Do not lift, over-exercise, drive, or do household chores until your caregiver gives you permission.   If you develop constipation, you may take a mild laxative recommended by your caregiver. Bran foods and drinking a lot of liquids helps with constipation.   Try to have someone help you with household chores for a week or two.   Make and keep all your follow up appointments and blood tests.   If you had methotrexate treatment:   Avoid all forms of alcohol.   Avoid vitamins with folic acid in them.   Do not take nonsteroidal anti-inflammatory drugs (NSAIDs), such as ibuprofen.   You and your partner may develop emotional problems. If so:   Grieving helps.   Counseling may be necessary.   Let your body and mind heal before trying to get pregnant again.  SEEK IMMEDIATE MEDICAL CARE IF:   You develop severe pain in the pelvis, abdomen, back, side, or shoulder, especially if you are pregnant.   You develop extreme weakness, fainting, repeated vomiting, or dehydration.   You develop heavy vaginal bleeding or passage of tissue.   You have an oral temperature above 102 F (38.9 C), not controlled by medicine.   You develop a rash or have a reaction to your medicine.   You pass out while having a bowel movement. This indicates a rupture of the tube, with internal bleeding.   You had abdominal surgery and:   There is bleeding from the incision.   There is redness, swelling, and pain around the incision.   There is pus coming out of the incision.   The incision is opening up.   You have shortness of breath.   You have chest or leg pain.   You become dizzy or pass out.   You have side effects from the methotrexate injection, including:   Nausea.   Vomiting.   Diarrhea.   Dizziness.  Document Released: 05/17/2004 Document Revised: 10/23/2010 Document Reviewed: 04/19/2009 [facility]  Patient Information 2012 [place], [place].

## 2011-06-21 NOTE — Progress Notes (Signed)
Patient changing a pad an hour during the night here for BHCG.

## 2011-06-24 ENCOUNTER — Inpatient Hospital Stay (HOSPITAL_COMMUNITY): Payer: Self-pay | Admitting: Anesthesiology

## 2011-06-24 ENCOUNTER — Encounter (HOSPITAL_COMMUNITY): Payer: Self-pay | Admitting: Anesthesiology

## 2011-06-24 ENCOUNTER — Encounter (HOSPITAL_COMMUNITY)
Admission: AD | Disposition: A | Discharge status: Home / Self Care | Payer: Self-pay | Source: Ambulatory Visit | Attending: Obstetrics & Gynecology

## 2011-06-24 ENCOUNTER — Encounter (HOSPITAL_COMMUNITY): Payer: Self-pay | Admitting: *Deleted

## 2011-06-24 ENCOUNTER — Ambulatory Visit (HOSPITAL_COMMUNITY)
Admission: AD | Admit: 2011-06-24 | Discharge: 2011-06-24 | Disposition: A | Discharge status: Home / Self Care | Payer: Self-pay | Source: Ambulatory Visit | Attending: Obstetrics & Gynecology | Admitting: Obstetrics & Gynecology

## 2011-06-24 ENCOUNTER — Inpatient Hospital Stay (HOSPITAL_COMMUNITY): Payer: Self-pay

## 2011-06-24 DIAGNOSIS — K661 Hemoperitoneum: Secondary | ICD-10-CM

## 2011-06-24 DIAGNOSIS — O00109 Unspecified tubal pregnancy without intrauterine pregnancy: Principal | ICD-10-CM | POA: Insufficient documentation

## 2011-06-24 DIAGNOSIS — O009 Unspecified ectopic pregnancy without intrauterine pregnancy: Secondary | ICD-10-CM

## 2011-06-24 HISTORY — PX: LAPAROSCOPY: SHX197

## 2011-06-24 LAB — TYPE AND SCREEN

## 2011-06-24 LAB — COMPREHENSIVE METABOLIC PANEL
ALT: 10 U/L (ref 0–35)
AST: 11 U/L (ref 0–37)
Alkaline Phosphatase: 50 U/L (ref 39–117)
CO2: 24 mEq/L (ref 19–32)
Calcium: 9.7 mg/dL (ref 8.4–10.5)
GFR calc non Af Amer: >90 mL/min (ref >90)
Glucose, Bld: 103 mg/dL — ABNORMAL HIGH (ref 70–99)
Potassium: 3.9 mEq/L (ref 3.5–5.1)
Sodium: 138 mEq/L (ref 135–145)

## 2011-06-24 LAB — CBC
Hemoglobin: 12.1 g/dL (ref 12.0–15.0)
MCH: 29.3 pg (ref 26.0–34.0)
Platelets: 262 10*3/uL (ref 150–400)
RBC: 4.13 MIL/uL (ref 3.87–5.11)
WBC: 6 10*3/uL (ref 4.0–10.5)

## 2011-06-24 LAB — HCG, QUANTITATIVE, PREGNANCY: hCG, Beta Chain, Quant, S: 967 m[IU]/mL — ABNORMAL HIGH (ref <5)

## 2011-06-24 SURGERY — LAPAROSCOPY OPERATIVE
Anesthesia: General | Site: Abdomen | Laterality: Right | Wound class: Clean Contaminated

## 2011-06-24 MED ORDER — SCOPOLAMINE 1 MG/3DAYS TD PT72
MEDICATED_PATCH | TRANSDERMAL | Status: DC | PRN
  Administered 2011-06-24: 1 via TRANSDERMAL

## 2011-06-24 MED ORDER — ACETAMINOPHEN 325 MG PO TABS: 325.0000 mg | ORAL_TABLET | ORAL | Status: DC | PRN

## 2011-06-24 MED ORDER — FAMOTIDINE IN NACL 20-0.9 MG/50ML-% IV SOLN
INTRAVENOUS | Status: AC
  Administered 2011-06-24: 20 mg via INTRAVENOUS
  Filled 2011-06-24: qty 50

## 2011-06-24 MED ORDER — ATROPINE SULFATE 0.4 MG/ML IJ SOLN
INTRAMUSCULAR | Status: DC | PRN
  Administered 2011-06-24: 0.4 mg via INTRAVENOUS

## 2011-06-24 MED ORDER — ONDANSETRON HCL 4 MG/2ML IJ SOLN
INTRAMUSCULAR | Status: DC | PRN
  Administered 2011-06-24: 4 mg via INTRAVENOUS

## 2011-06-24 MED ORDER — OXYCODONE-ACETAMINOPHEN 5-325 MG PO TABS
1.0000 | ORAL_TABLET | ORAL | Status: AC | PRN
Start: 2011-06-24 — End: 2011-07-04

## 2011-06-24 MED ORDER — DEXAMETHASONE SODIUM PHOSPHATE 4 MG/ML IJ SOLN
INTRAMUSCULAR | Status: DC | PRN
  Administered 2011-06-24: 10 mg via INTRAVENOUS

## 2011-06-24 MED ORDER — FAMOTIDINE IN NACL 20-0.9 MG/50ML-% IV SOLN
20.0000 mg | Freq: Once | INTRAVENOUS | Status: AC
  Administered 2011-06-24: 20 mg via INTRAVENOUS

## 2011-06-24 MED ORDER — LACTATED RINGERS IV SOLN
INTRAVENOUS | Status: DC | PRN
  Administered 2011-06-24 (×3): via INTRAVENOUS

## 2011-06-24 MED ORDER — BUPIVACAINE HCL (PF) 0.25 % IJ SOLN
INTRAMUSCULAR | Status: DC | PRN
  Administered 2011-06-24: 10 mL

## 2011-06-24 MED ORDER — PROPOFOL 10 MG/ML IV EMUL
INTRAVENOUS | Status: DC | PRN
  Administered 2011-06-24: 180 mg via INTRAVENOUS

## 2011-06-24 MED ORDER — EPHEDRINE SULFATE 50 MG/ML IJ SOLN
INTRAMUSCULAR | Status: DC | PRN
  Administered 2011-06-24 (×2): 10 mg via INTRAVENOUS

## 2011-06-24 MED ORDER — OXYCODONE-ACETAMINOPHEN 5-325 MG PO TABS
ORAL_TABLET | ORAL | Status: AC
  Administered 2011-06-24: 1 via ORAL
  Filled 2011-06-24: qty 1

## 2011-06-24 MED ORDER — FENTANYL CITRATE 0.05 MG/ML IJ SOLN
INTRAMUSCULAR | Status: AC
  Administered 2011-06-24: 25 ug via INTRAVENOUS
  Filled 2011-06-24: qty 2

## 2011-06-24 MED ORDER — SUCCINYLCHOLINE CHLORIDE 20 MG/ML IJ SOLN
INTRAMUSCULAR | Status: DC | PRN
  Administered 2011-06-24: 100 mg via INTRAVENOUS

## 2011-06-24 MED ORDER — NEOSTIGMINE METHYLSULFATE 1 MG/ML IJ SOLN
INTRAMUSCULAR | Status: DC | PRN
  Administered 2011-06-24: 2.5 mg via INTRAVENOUS

## 2011-06-24 MED ORDER — MIDAZOLAM HCL 5 MG/5ML IJ SOLN
INTRAMUSCULAR | Status: DC | PRN
  Administered 2011-06-24: 2 mg via INTRAVENOUS

## 2011-06-24 MED ORDER — LACTATED RINGERS IR SOLN
Status: DC | PRN
  Administered 2011-06-24: 3000 mL

## 2011-06-24 MED ORDER — ROCURONIUM BROMIDE 100 MG/10ML IV SOLN
INTRAVENOUS | Status: DC | PRN
  Administered 2011-06-24: 25 mg via INTRAVENOUS

## 2011-06-24 MED ORDER — HYDROMORPHONE HCL PF 1 MG/ML IJ SOLN
1.0000 mg | Freq: Once | INTRAMUSCULAR | Status: AC
  Administered 2011-06-24: 1 mg via INTRAVENOUS
  Filled 2011-06-24: qty 1

## 2011-06-24 MED ORDER — OXYCODONE-ACETAMINOPHEN 5-325 MG PO TABS
1.0000 | ORAL_TABLET | ORAL | Status: DC | PRN
  Administered 2011-06-24: 1 via ORAL

## 2011-06-24 MED ORDER — FENTANYL CITRATE 0.05 MG/ML IJ SOLN
25.0000 ug | INTRAMUSCULAR | Status: DC | PRN
  Administered 2011-06-24: 25 ug via INTRAVENOUS

## 2011-06-24 MED ORDER — LACTATED RINGERS IV BOLUS (SEPSIS)
1000.0000 mL | Freq: Once | INTRAVENOUS | Status: AC
  Administered 2011-06-24: 1000 mL via INTRAVENOUS

## 2011-06-24 MED ORDER — SCOPOLAMINE 1 MG/3DAYS TD PT72
MEDICATED_PATCH | TRANSDERMAL | Status: AC
  Filled 2011-06-24: qty 1

## 2011-06-24 MED ORDER — CEFAZOLIN SODIUM-DEXTROSE 2-3 GM-% IV SOLR: 2.0000 g | INTRAVENOUS | Status: DC

## 2011-06-24 MED ORDER — CEFAZOLIN SODIUM 1-5 GM-% IV SOLN
INTRAVENOUS | Status: AC
  Administered 2011-06-24: 1 g via INTRAVENOUS
  Filled 2011-06-24: qty 50

## 2011-06-24 MED ORDER — GLYCOPYRROLATE 0.2 MG/ML IJ SOLN
INTRAMUSCULAR | Status: DC | PRN
  Administered 2011-06-24: .4 mg via INTRAVENOUS
  Administered 2011-06-24: .5 mg via INTRAVENOUS

## 2011-06-24 MED ORDER — SODIUM CHLORIDE 0.9 % IV SOLN: INTRAVENOUS | Status: DC

## 2011-06-24 MED ORDER — FENTANYL CITRATE 0.05 MG/ML IJ SOLN
INTRAMUSCULAR | Status: DC | PRN
  Administered 2011-06-24 (×5): 50 ug via INTRAVENOUS

## 2011-06-24 MED ORDER — LIDOCAINE HCL (CARDIAC) 20 MG/ML IV SOLN
INTRAVENOUS | Status: DC | PRN
  Administered 2011-06-24: 20 mg via INTRAVENOUS

## 2011-06-24 SURGICAL SUPPLY — 28 items
APPLICATOR COTTON TIP 6IN STRL (MISCELLANEOUS) ×2 IMPLANT
BAG SPEC RTRVL LRG 6X4 10 (ENDOMECHANICALS)
BLADE SURG 15 STRL LF C SS BP (BLADE) ×1 IMPLANT
BLADE SURG 15 STRL SS (BLADE) ×2
CHLORAPREP W/TINT 26ML (MISCELLANEOUS) ×1 IMPLANT
CLOTH BEACON ORANGE TIMEOUT ST (SAFETY) ×2 IMPLANT
DRSG COVADERM PLUS 2X2 (GAUZE/BANDAGES/DRESSINGS) ×1 IMPLANT
GLOVE BIO SURGEON STRL SZ7 (GLOVE) ×2 IMPLANT
GLOVE BIOGEL PI IND STRL 7.0 (GLOVE) ×2 IMPLANT
GLOVE BIOGEL PI INDICATOR 7.0 (GLOVE) ×2
GOWN PREVENTION PLUS LG XLONG (DISPOSABLE) ×4 IMPLANT
NDL INSUFFLATION 14GA 120MM (NEEDLE) IMPLANT
NEEDLE INSUFFLATION 14GA 120MM (NEEDLE) IMPLANT
NS IRRIG 1000ML POUR BTL (IV SOLUTION) ×1 IMPLANT
PACK LAPAROSCOPY BASIN (CUSTOM PROCEDURE TRAY) ×2 IMPLANT
POUCH SPECIMEN RETRIEVAL 10MM (ENDOMECHANICALS) IMPLANT
PROTECTOR NERVE ULNAR (MISCELLANEOUS) ×2 IMPLANT
SCALPEL HARMONIC ACE (MISCELLANEOUS) IMPLANT
SET IRRIG TUBING LAPAROSCOPIC (IRRIGATION / IRRIGATOR) ×1 IMPLANT
SUT VICRYL 0 ENDOLOOP (SUTURE) IMPLANT
SUT VICRYL 0 UR6 27IN ABS (SUTURE) ×5 IMPLANT
SUT VICRYL 4-0 PS2 18IN ABS (SUTURE) ×5 IMPLANT
TOWEL OR 17X24 6PK STRL BLUE (TOWEL DISPOSABLE) ×4 IMPLANT
TRAY FOLEY CATH 14FR (SET/KITS/TRAYS/PACK) ×2 IMPLANT
TROCAR BALLN 12MMX100 BLUNT (TROCAR) IMPLANT
TROCAR XCEL NON-BLD 11X100MML (ENDOMECHANICALS) ×1 IMPLANT
TROCAR XCEL NON-BLD 5MMX100MML (ENDOMECHANICALS) ×4 IMPLANT
WATER STERILE IRR 1000ML POUR (IV SOLUTION) ×1 IMPLANT

## 2011-06-24 NOTE — Anesthesia Procedure Notes (Signed)
Procedure Name: Intubation Date/Time: 06/24/2011 11:14 AM Performed by: Lincoln Brigham Pre-anesthesia Checklist: Patient identified, Patient being monitored, Emergency Drugs available, Timeout performed and Suction available Patient Re-evaluated:Patient Re-evaluated prior to inductionOxygen Delivery Method: Circle system utilized Preoxygenation: Pre-oxygenation with 100% oxygen Intubation Type: IV induction, Rapid sequence and Cricoid Pressure applied Laryngoscope Size: Miller and 2 Grade View: Grade I Tube type: Oral Tube size: 7.0 mm Number of attempts: 1 Airway Equipment and Method: Stylet Placement Confirmation: ETT inserted through vocal cords under direct vision,  breath sounds checked- equal and bilateral and positive ETCO2 Secured at: 21 cm Tube secured with: Tape Dental Injury: Teeth and Oropharynx as per pre-operative assessment

## 2011-06-24 NOTE — Transfer of Care (Signed)
Immediate Anesthesia Transfer of Care Note  Patient: Brandy Hansen  Procedure(s) Performed: Procedure(s) (LRB): LAPAROSCOPY OPERATIVE (Right)  Patient Location: PACU  Anesthesia Type: General  Level of Consciousness: awake, alert  and oriented  Airway & Oxygen Therapy: Patient Spontanous Breathing and Patient connected to nasal cannula oxygen  Post-op Assessment: Report given to PACU RN and Post -op Vital signs reviewed and stable  Post vital signs: Reviewed and stable  Complications: No apparent anesthesia complications

## 2011-06-24 NOTE — Anesthesia Preprocedure Evaluation (Signed)
Anesthesia Evaluation  Patient identified by MRN, date of birth, ID band Patient awake    Reviewed: Allergy & Precautions, H&P , Patient's Chart, lab work & pertinent test results, reviewed documented beta blocker date and time   History of Anesthesia Complications (+) PONV  Airway Mallampati: II TM Distance: >3 FB Neck ROM: full    Dental No notable dental hx.    Pulmonary neg pulmonary ROS,  breath sounds clear to auscultation  Pulmonary exam normal       Cardiovascular Exercise Tolerance: Good negative cardio ROS  Rhythm:regular Rate:Normal     Neuro/Psych PSYCHIATRIC DISORDERS negative neurological ROS  negative psych ROS   GI/Hepatic negative GI ROS, Neg liver ROS,   Endo/Other  negative endocrine ROS  Renal/GU negative Renal ROS     Musculoskeletal   Abdominal   Peds  Hematology negative hematology ROS (+)   Anesthesia Other Findings   Reproductive/Obstetrics negative OB ROS                           Anesthesia Physical Anesthesia Plan  ASA: II and Emergent  Anesthesia Plan: General ETT   Post-op Pain Management:    Induction: Rapid sequence, Cricoid pressure planned and Intravenous  Airway Management Planned: Oral ETT  Additional Equipment:   Intra-op Plan:   Post-operative Plan:   Informed Consent: I have reviewed the patients History and Physical, chart, labs and discussed the procedure including the risks, benefits and alternatives for the proposed anesthesia with the patient or authorized representative who has indicated his/her understanding and acceptance.   Dental Advisory Given  Plan Discussed with: CRNA and Surgeon  Anesthesia Plan Comments:         Anesthesia Quick Evaluation

## 2011-06-24 NOTE — Anesthesia Postprocedure Evaluation (Addendum)
Anesthesia Post Note  Patient: Brandy Hansen  Procedure(s) Performed: Procedure(s) (LRB): LAPAROSCOPY OPERATIVE (Right)  Anesthesia type: General  Patient location: PACU  Post pain: Pain level controlled, having some pain likely related to insufflation from laparoscopy, improving with fentanyl and percocet  Post assessment: Post-op Vital signs reviewed  Last Vitals:  Filed Vitals:   06/24/11 1407  BP: 103/46  Pulse: 76  Temp:   Resp: 16    Post vital signs: Reviewed  Level of consciousness: sedated  Complications: No apparent anesthesia complications

## 2011-06-24 NOTE — Op Note (Signed)
Brandy Hansen PROCEDURE DATE: 06/24/2011  PREOPERATIVE DIAGNOSIS: Ruptured ectopic pregnancy POSTOPERATIVE DIAGNOSIS: Ruptured right fallopian tube ectopic pregnancy PROCEDURE: Laparoscopic right salpingectomy and removal of ectopic pregnancy SURGEON:  Dr. Elsie Lincoln ASSISTANT: Zerita Boers ANESTHESIOLOGIST: Sheral Apley  INDICATIONS: 26 y.o. G2P1001 at [redacted]w[redacted]d here for with ruptured ectopic pregnancy. On exam, she had stable vital signs, and an acute abdomen. Patient was counseled regarding need for laparoscopic salpingectomy. Risks of surgery including bleeding which may require transfusion or reoperation, infection, injury to bowel or other surrounding organs, need for additional procedures including laparotomy and other postoperative/anesthesia complications were explained to patient.  Written informed consent was obtained.  FINDINGS:  Moderate amount of hemoperitoneum estimated to be about 150 of blood and clots.  Dilated right fallopian tube containing ectopic gestation. Small normal appearing uterus, left fallopian tube is dilated and with evidence of old infection, nml right ovary and left ovary.  ANESTHESIA: General ESTIMATED BLOOD LOSS: 150 ml SPECIMENS: right fallopian tube containing ectopic gestation COMPLICATIONS: None immediate  PROCEDURE IN DETAIL:  The patient was taken to the operating room where general anesthesia was administered and was found to be adequate.  She was placed in the dorsal lithotomy position, and was prepped and draped in a sterile manner.  A Foley catheter was inserted into her bladder and attached to constant drainage and a uterine manipulator was then advanced into the uterus .  After an adequate timeout was performed, attention was then turned to the patient's abdomen where a 10-mm skin incision was made on the umbilical fold.  The Veress needle was carefully introduced into the peritoneal cavity at a 45-degree angle into the abdominal wall.   Intraperitoneal placement was confirmed by drop in intraabdominal pressure with insufflation of carbon dioxide gas.  Adequate pneumoperitoneum was obtained, and the 10-mm XL trocar and sleeve were then advanced without difficulty into the abdomen where intraabdominal placement was confirmed by the laparoscope. A survey of the patient's pelvis and abdomen revealed the findings as above.  Two ports were place in the left lower quadrant under direct visualization;  5-mm and 10-mm XL trocarst.  The 10-mm Nezhat suction irrigator was then used to suction the hemoperitoneum and irrigate the pelvis.  Attention was then turned to the right fallopian tube which was grasped and ligated from the underlying mesosalpinx and uterine attachment using the Harmonic Scapel instrument.  Good hemostasis was noted.  The specimen was placed in an EndoCatch bag and removed from the abdomen intact.  The abdomen was desufflated, and all instruments were removed.  The 10-mm LLQ fascial incision was closed with  0 Vicryl figure-of-eight stich;  All other incisions were less than 1 cm.   The skin incisions were closed with a 3-0 Vicryl subcuticular stitch. The hulka clip was removed from the uterus.  The patient tolerated the procedures well.  All instruments, needles, and sponge counts were correct x 2. The patient was taken to the recovery room in stable condition.    Brandy Hansen H. 06/24/2011 12:37 PM

## 2011-06-24 NOTE — ED Notes (Signed)
Pt to OR.

## 2011-06-24 NOTE — Discharge Instructions (Signed)
Diagnostic Laparoscopy Laparoscopy is a surgical procedure. It is used to diagnose and treat diseases inside the belly(abdomen). It is usually a brief, common, and relatively simple procedure. The laparoscopeis a thin, lighted, pencil-sized instrument. It is like a telescope. It is inserted into your abdomen through a small cut (incision). Your caregiver can look at the organs inside your body through this instrument. He or she can see if there is anything abnormal. Laparoscopy can be done either in a hospital or outpatient clinic. You may be given a mild sedative to help you relax before the procedure. Once in the operating room, you will be given a drug to make you sleep (general anesthesia). Laparoscopy usually lasts less than 1 hour. After the procedure, you will be moniEctopic Pregnancy An ectopic pregnancy is when the fertilized egg attaches (implants) outside the uterus. Most ectopic pregnancies occur in the fallopian tube. Rarely do ectopic pregnancies occur on the ovary, intestine, pelvis, or cervix. An ectopic pregnancy does not have the ability to develop into a normal, healthy baby.  A ruptured ectopic pregnancy is one in which the fallopian tube gets torn or bursts and results in internal bleeding. Often there is intense abdominal pain, and sometimes, vaginal bleeding. Having an ectopic pregnancy can be a life-threatening experience. If left untreated, this dangerous condition can lead to a blood transfusion, abdominal surgery, or even death. CAUSES  Damage to the fallopian tubes is the suspected cause in most ectopic pregnancies.  RISK FACTORS Depending on your circumstances, the amount of risk of having an ectopic pregnancy will vary. There are 3 categories that may help you identify whether you are potentially at risk. High Risk  You have gone through infertility treatment.   You have had a previous ectopic pregnancy.   You have had previous tubal surgery.   You have had previous  surgery to have the fallopian tubes tied (tubal ligation).   You have tubal problems or diseases.   You have been exposed to DES. DES is a medicine that was used until 1971 and had effects on babies whose mothers took the medicine.   You become pregnant while using an intrauterine device (IUD) for birth control.  Moderate Risk  You have a history of infertility.   You have a history of a sexually transmitted infection (STI).   You have a history of pelvic inflammatory disease (PID).   You have scarring from endometriosis.   You have multiple sexual partners.   You smoke.  Low Risk  You have had previous pelvic surgery.   You use vaginal douching.   You became sexually active before 28 years of age.  SYMPTOMS  An ectopic pregnancy should be suspected in anyone who has missed a period and has abdominal pain or bleeding.  You may experience normal pregnancy symptoms, such as:   Nausea.   Tiredness.   Breast tenderness.   Symptoms that are not normal include:   Pain with intercourse.   Irregular vaginal bleeding or spotting.   Cramping or pain on one side, or in the lower abdomen.   Fast heartbeat.   Passing out while having a bowel movement.   Symptoms of a ruptured ectopic pregnancy and internal bleeding may include:   Sudden, severe pain in the abdomen and pelvis.   Dizziness or fainting.   Pain in the shoulder area.  DIAGNOSIS  Tests that may be performed include:  A pregnancy test.   An ultrasound.   Testing the specific level of pregnancy  hormone in the bloodstream.   Taking a sample of uterus tissue (dilation and curettage, D&C).   Surgery to perform a visual exam of the inside of the abdomen using a lighted tube (laparoscopy).  TREATMENT  An injection of methotrexate medicine may be given. This is given if:  The diagnosis is made early.   The fallopian tube has not ruptured.   You are considered to be a good candidate for the medicine.   Usually, pregnancy hormone blood levels are checked after methotrexate treatment. This is to be sure the medicine is effective. It may take 4 to 6 weeks for the pregnancy to be absorbed (though most pregnancies will be absorbed by 3 weeks). Surgical treatment may be needed. A lighted tube (laparoscope) is used to remove the tubal pregnancy. If severe internal bleeding occurs, a cut (incision) may be made in the lower abdomen (laparotomy), and the ectopic pregnancy is removed. This stops the bleeding. Part of the fallopian tube, or the whole tube, may be removed as well (salpingectomy). After surgery, pregnancy hormone tests may be done to be sure there is no pregnancy tissue left. You may receive a RhoGAM shot if you are Rh negative and the father is Rh positive, or if you do not know the Rh type of the father. This is to prevent problems with any future pregnancy. SEEK IMMEDIATE MEDICAL CARE IF:  You have any symptoms of an ectopic pregnancy. This is a medical emergency. Document Released: 05/17/2004 Document Revised: 03/29/2011 Document Reviewed: 12/14/2010 [employer] Patient Information 2012 [employer].tored in a recovery area until you are stable and doing well. Once you are home, it will take 2 to 3 days to fully recover. RISKS AND COMPLICATIONS  Laparoscopy has relatively few risks. Your caregiver will discuss the risks with you before the procedure. Some problems that can occur include:  Infection.   Bleeding.   Damage to other organs.   Anesthetic side effects.  PROCEDURE Once you receive anesthesia, your surgeon inflates the abdomen with a harmless gas (carbon dioxide). This makes the organs easier to see. The laparoscope is inserted into the abdomen through a small incision. This allows your surgeon to see into the abdomen. Other small instruments are also inserted into the abdomen through other small openings. Many surgeons attach a video camera to the laparoscope to enlarge the  view. During a diagnostic laparoscopy, the surgeon may be looking for inflammation, infection, or cancer. Your surgeon may take tissue samples(biopsies). The samples are sent to a specialist in looking at cells and tissue samples (pathologist). The pathologist examines them under a microscope. Biopsies can help to diagnose or confirm a disease. AFTER THE PROCEDURE   The gas is released from inside the abdomen.   The incisions are closed with stitches (sutures). Because these incisions are small (usually less than 1/2 inch), there is usually minimal discomfort after the procedure. There may be some mild discomfort in the throat. This is from the tube placed in the throat while you were sleeping. You may have some mild abdominal discomfort. There may also be discomfort from the instrument placement incisions in the abdomen.   The recovery time is shortened as long as there are no complications.   You will rest in a recovery room until stable and doing well. As long as there are no complications, you may be allowed to go home.  FINDING OUT THE RESULTS OF YOUR TEST Not all test results are available during your visit. If your test results  are not back during the visit, make an appointment with your caregiver to find out the results. Do not assume everything is normal if you have not heard from your caregiver or the medical facility. It is important for you to follow up on all of your test results. HOME CARE INSTRUCTIONS   Take all medicines as directed.   Only take over-the-counter or prescription medicines for pain, discomfort, or fever as directed by your caregiver.   Resume daily activities as directed.   Showers are preferred over baths.   You may resume sexual activities in 1 week or as directed.   Do not drive while taking narcotics.  SEEK MEDICAL CARE IF:   There is increasing abdominal pain.   There is new pain in the shoulders (shoulder strap areas).   You feel lightheaded or  faint.   You have the chills.   You or your child has an oral temperature above 102 F (38.9 C).   There is pus-like (purulent) drainage from any of the wounds.   You are unable to pass gas or have a bowel movement.   You feel sick to your stomach (nauseous) or throw up (vomit).  MAKE SURE YOU:   Understand these instructions.   Will watch your condition.   Will get help right away if you are not doing well or get worse.  Document Released: 07/16/2000 Document Revised: 03/29/2011 Document Reviewed: 04/09/2007 [facility] Patient Information 2012 [place], [place].

## 2011-06-24 NOTE — Progress Notes (Signed)
Pt here for f/u u/s having a lot of abd pain in LRQ brought pt straight back to room eval by  V.SMITH,CNM stat U/s and labs ordered

## 2011-06-24 NOTE — ED Provider Notes (Signed)
History   Brandy Hansen is a 26 y.o. year old G48P1001 female at [redacted]w[redacted]d weeks gestation who presents to [facility] for F/U quant 7 days S/P MTX for ectopic pregnancy. She reports severe RLQ and rectal pain that started when she was driving to the hospital this morning. Vaginal bleeding is present, but less than last visit. Last food 06/23/11 2200. Last fluids small amount of orange juice at ~0700.   CSN: [id number]  Arrival date & time 06/24/11  0907   None     Chief Complaint  Patient presents with  . Ectopic Pregnancy    (Consider location/radiation/quality/duration/timing/severity/associated sxs/prior treatment) HPI  Past Medical History  Diagnosis Date  . Mental disorder   . Depression   . Anxiety   . Bipolar 1 disorder   . PONV (postoperative nausea and vomiting)   . Chronic kidney disease     ? problems as child, "stretched" fine since  . Ovarian cyst   . HPV (human papilloma virus) anogenital infection     Past Surgical History  Procedure Date  . Wrist surgery     Family History  Problem Relation Age of Onset  . Anesthesia problems Neg Hx   . Diabetes Mother   . Hypertension Father     History  Substance Use Topics  . Smoking status: Former Smoker    Types: Cigarettes  . Smokeless tobacco: Never Used  . Alcohol Use: No    OB History    Grav Para Term Preterm Abortions TAB SAB Ect Mult Living   2 1 1       1       Review of Systems  Allergies  Codeine; Naproxen; and Sulfa antibiotics  Home Medications  No current outpatient prescriptions on file.  BP 129/72  Pulse 126  Temp(Src) 98.2 F (36.8 C) (Oral)  Resp 18  LMP 05/01/2011  Physical Exam  Constitutional: She is oriented to person, place, and time. She appears well-developed and well-nourished. She appears distressed (severe. ).  HENT:  Head: Normocephalic.  Eyes: Pupils are equal, round, and reactive to light.  Cardiovascular: Regular rhythm.  Tachycardia present.   Pulmonary/Chest:  Effort normal and breath sounds normal.  Abdominal: Soft. She exhibits no distension and no mass. There is tenderness.  Genitourinary: Uterus is not enlarged. Cervix exhibits motion tenderness. Right adnexum displays tenderness and fullness. Left adnexum displays no tenderness and no fullness. There is bleeding (small BRB) around the vagina.  Musculoskeletal: Normal range of motion.  Neurological: She is alert and oriented to person, place, and time. She has normal reflexes.  Skin: Skin is warm and dry. No pallor.  Psychiatric: She has a normal mood and affect.    ED Course  Procedures (including critical care time)  [phone]: STAT BS vaginal [place], CBC, CMET, Quant, T&S 0945: Dr. Penne Lash notified, coming to [facility]   Labs Reviewed  CBC  HCG, QUANTITATIVE, PREGNANCY  COMPREHENSIVE METABOLIC PANEL  TYPE AND SCREEN  Labs pending  US shows free fluid  Right ruptured ectopic    MDM  Prep for OR  SMITH, [place] 06/24/2011 10:17 AM  Pt seen and examined.  Pt consented for laparoscopic right salpingectomy.  Risks include but not limited to bleeding, infection, and damage to intraabdominal organs.  All questions answered.

## 2011-06-24 NOTE — H&P (Signed)
History   Brandy Hansen is a 26 y.o. year old G63P1001 female at [redacted]w[redacted]d weeks gestation who presents to [facility] for F/U quant 7 days S/P MTX for ectopic pregnancy. She reports severe RLQ and rectal pain that started when she was driving to the hospital this morning. Vaginal bleeding is present, but less than last visit. Last food 06/23/11 2200. Last fluids small amount of orange juice at ~0700.  CSN: [id number]  Arrival date & time 06/24/11 0907  None  Chief Complaint   Patient presents with   .  Ectopic Pregnancy   (Consider location/radiation/quality/duration/timing/severity/associated sxs/prior  treatment)  HPI  Past Medical History   Diagnosis  Date   .  Mental disorder    .  Depression    .  Anxiety    .  Bipolar 1 disorder    .  PONV (postoperative nausea and vomiting)    .  Chronic kidney disease      ? problems as child, "stretched" fine since   .  Ovarian cyst    .  HPV (human papilloma virus) anogenital infection     Past Surgical History   Procedure  Date   .  Wrist surgery     Family History   Problem  Relation  Age of Onset   .  Anesthesia problems  Neg Hx    .  Diabetes  Mother    .  Hypertension  Father     History   Substance Use Topics   .  Smoking status:  Former Smoker     Types:  Cigarettes   .  Smokeless tobacco:  Never Used   .  Alcohol Use:  No    OB History    Grav  Para  Term  Preterm  Abortions  TAB  SAB  Ect  Mult  Living    2  1  1        1      Review of Systems  Allergies   Codeine; Naproxen; and Sulfa antibiotics  Home Medications   No current outpatient prescriptions on file.  BP 129/72  Pulse 126  Temp(Src) 98.2 F (36.8 C) (Oral)  Resp 18  LMP 05/01/2011  Physical Exam  Constitutional: She is oriented to person, place, and time. She appears well-developed and well-nourished. She appears distressed (severe. ).  HENT:  Head: Normocephalic.  Eyes: Pupils are equal, round, and reactive to light.  Cardiovascular: Regular rhythm.  Tachycardia present.  Pulmonary/Chest: Effort normal and breath sounds normal.  Abdominal: Soft. She exhibits no distension and no mass. There is tenderness.  Genitourinary: Uterus is not enlarged. Cervix exhibits motion tenderness. Right adnexum displays tenderness and fullness. Left adnexum displays no tenderness and no fullness. There is bleeding (small BRB) around the vagina.  Musculoskeletal: Normal range of motion.  Neurological: She is alert and oriented to person, place, and time. She has normal reflexes.  Skin: Skin is warm and dry. No pallor.  Psychiatric: She has a normal mood and affect.  ED Course   Procedures (including critical care time)  [phone]: STAT BS vaginal [place], CBC, CMET, Quant, T&S  0945: Dr. Penne Lash notified, coming to [facility]  Labs Reviewed   CBC   HCG, QUANTITATIVE, PREGNANCY   COMPREHENSIVE METABOLIC PANEL   TYPE AND SCREEN   Labs pending  US shows free fluid  Right ruptured ectopic  MDM   Prep for OR  SMITH, [place]  06/24/2011  10:17 AM  Pt seen and examined. Pt  consented for laparoscopic right salpingectomy. Risks include but not limited to bleeding, infection, and damage to intraabdominal organs. All questions answered.   Elsie Lincoln

## 2011-06-25 ENCOUNTER — Encounter (HOSPITAL_COMMUNITY): Payer: Self-pay | Admitting: Obstetrics & Gynecology

## 2011-07-19 ENCOUNTER — Ambulatory Visit (INDEPENDENT_AMBULATORY_CARE_PROVIDER_SITE_OTHER): Payer: Self-pay | Admitting: Obstetrics & Gynecology

## 2011-07-19 ENCOUNTER — Encounter: Payer: Self-pay | Admitting: Obstetrics & Gynecology

## 2011-07-19 DIAGNOSIS — N912 Amenorrhea, unspecified: Secondary | ICD-10-CM

## 2011-07-19 DIAGNOSIS — O009 Unspecified ectopic pregnancy without intrauterine pregnancy: Secondary | ICD-10-CM

## 2011-07-19 DIAGNOSIS — Z8742 Personal history of other diseases of the female genital tract: Secondary | ICD-10-CM

## 2011-07-19 DIAGNOSIS — Z8759 Personal history of other complications of pregnancy, childbirth and the puerperium: Secondary | ICD-10-CM | POA: Insufficient documentation

## 2011-07-19 NOTE — Progress Notes (Signed)
  Subjective:    Patient ID: Brandy Hansen, female    DOB: 1986/01/26, 26 y.o.   MRN: [id number]  HPI Pt had LSC right salpingectomy for ectopic pregnancy approx 1 month ago.  No complaints.  Path shows POC in tube.  Left tube showed evidence of old infection.  Pt would like to try again soon for pregnancy.  Pt to wati one cycle and then try.  Pt to take prenatal vitamins now.   Review of Systems  Constitutional: Negative.   Respiratory: Negative.   Cardiovascular: Negative.   Gastrointestinal: Negative.   Genitourinary: Negative.        Objective:   Physical Exam  Vitals reviewed. Constitutional: She appears well-developed and well-nourished.  HENT:  Head: Normocephalic and atraumatic.  Pulmonary/Chest: Effort normal.  Abdominal: Soft. She exhibits no distension. There is no tenderness. There is no rebound.  Skin: Skin is warm and dry.  Psychiatric: She has a normal mood and affect.          Assessment & Plan:  26 yo female s/p LSC right salpingectomy for ectopic pregnancy approx 1 month ago  UPT-negative Prenatal vitamins Ectopic precautions with next pregnancy Pt sees [facility] for [facility] care maintenance

## 2012-04-23 DIAGNOSIS — Z87410 Personal history of cervical dysplasia: Secondary | ICD-10-CM

## 2012-04-23 HISTORY — DX: Personal history of cervical dysplasia: Z87.410

## 2012-09-29 ENCOUNTER — Other Ambulatory Visit (HOSPITAL_COMMUNITY): Payer: Self-pay | Admitting: Obstetrics and Gynecology

## 2012-09-29 DIAGNOSIS — E049 Nontoxic goiter, unspecified: Secondary | ICD-10-CM

## 2012-09-29 DIAGNOSIS — E040 Nontoxic diffuse goiter: Secondary | ICD-10-CM

## 2012-10-03 ENCOUNTER — Other Ambulatory Visit (HOSPITAL_COMMUNITY): Payer: Self-pay | Admitting: Obstetrics and Gynecology

## 2012-10-03 ENCOUNTER — Ambulatory Visit (HOSPITAL_COMMUNITY)
Admission: RE | Admit: 2012-10-03 | Discharge: 2012-10-03 | Disposition: A | Discharge status: Home / Self Care | Payer: Self-pay | Source: Ambulatory Visit | Attending: Obstetrics and Gynecology | Admitting: Obstetrics and Gynecology

## 2012-10-03 DIAGNOSIS — E049 Nontoxic goiter, unspecified: Secondary | ICD-10-CM | POA: Insufficient documentation

## 2012-10-03 DIAGNOSIS — N979 Female infertility, unspecified: Secondary | ICD-10-CM | POA: Insufficient documentation

## 2012-10-03 DIAGNOSIS — E040 Nontoxic diffuse goiter: Secondary | ICD-10-CM

## 2012-10-10 ENCOUNTER — Ambulatory Visit (HOSPITAL_COMMUNITY)
Admission: RE | Admit: 2012-10-10 | Discharge: 2012-10-10 | Disposition: A | Discharge status: Home / Self Care | Payer: Self-pay | Source: Ambulatory Visit | Attending: Obstetrics and Gynecology | Admitting: Obstetrics and Gynecology

## 2012-10-10 DIAGNOSIS — N979 Female infertility, unspecified: Secondary | ICD-10-CM | POA: Insufficient documentation

## 2012-10-10 MED ORDER — IOHEXOL 300 MG/ML  SOLN
20.0000 mL | Freq: Once | INTRAMUSCULAR | Status: AC | PRN
  Administered 2012-10-10: 5 mL

## 2012-10-27 ENCOUNTER — Other Ambulatory Visit: Payer: Self-pay | Admitting: Obstetrics and Gynecology

## 2014-02-22 ENCOUNTER — Encounter: Payer: Self-pay | Admitting: Obstetrics & Gynecology

## 2014-10-11 ENCOUNTER — Other Ambulatory Visit: Payer: Self-pay | Admitting: Orthopedic Surgery

## 2014-10-20 ENCOUNTER — Encounter (HOSPITAL_BASED_OUTPATIENT_CLINIC_OR_DEPARTMENT_OTHER): Payer: Self-pay | Admitting: *Deleted

## 2014-10-26 ENCOUNTER — Encounter (HOSPITAL_BASED_OUTPATIENT_CLINIC_OR_DEPARTMENT_OTHER): Payer: Self-pay | Admitting: *Deleted

## 2014-10-27 ENCOUNTER — Ambulatory Visit (HOSPITAL_BASED_OUTPATIENT_CLINIC_OR_DEPARTMENT_OTHER): Payer: BLUE CROSS/BLUE SHIELD | Admitting: Certified Registered"

## 2014-10-27 ENCOUNTER — Ambulatory Visit (HOSPITAL_COMMUNITY): Payer: BLUE CROSS/BLUE SHIELD

## 2014-10-27 ENCOUNTER — Encounter (HOSPITAL_BASED_OUTPATIENT_CLINIC_OR_DEPARTMENT_OTHER): Payer: Self-pay | Admitting: Certified Registered"

## 2014-10-27 ENCOUNTER — Encounter (HOSPITAL_BASED_OUTPATIENT_CLINIC_OR_DEPARTMENT_OTHER)
Admission: RE | Disposition: A | Discharge status: Home / Self Care | Payer: Self-pay | Source: Ambulatory Visit | Attending: Orthopedic Surgery

## 2014-10-27 ENCOUNTER — Ambulatory Visit (HOSPITAL_BASED_OUTPATIENT_CLINIC_OR_DEPARTMENT_OTHER)
Admission: RE | Admit: 2014-10-27 | Discharge: 2014-10-27 | Disposition: A | Discharge status: Home / Self Care | Payer: BLUE CROSS/BLUE SHIELD | Source: Ambulatory Visit | Attending: Orthopedic Surgery | Admitting: Orthopedic Surgery

## 2014-10-27 DIAGNOSIS — F419 Anxiety disorder, unspecified: Secondary | ICD-10-CM | POA: Insufficient documentation

## 2014-10-27 DIAGNOSIS — M2341 Loose body in knee, right knee: Secondary | ICD-10-CM | POA: Diagnosis not present

## 2014-10-27 DIAGNOSIS — F319 Bipolar disorder, unspecified: Secondary | ICD-10-CM | POA: Diagnosis not present

## 2014-10-27 DIAGNOSIS — N189 Chronic kidney disease, unspecified: Secondary | ICD-10-CM | POA: Diagnosis not present

## 2014-10-27 DIAGNOSIS — Z01818 Encounter for other preprocedural examination: Secondary | ICD-10-CM

## 2014-10-27 DIAGNOSIS — Z882 Allergy status to sulfonamides status: Secondary | ICD-10-CM | POA: Insufficient documentation

## 2014-10-27 DIAGNOSIS — Z79899 Other long term (current) drug therapy: Secondary | ICD-10-CM | POA: Insufficient documentation

## 2014-10-27 DIAGNOSIS — Z888 Allergy status to other drugs, medicaments and biological substances status: Secondary | ICD-10-CM | POA: Insufficient documentation

## 2014-10-27 DIAGNOSIS — F17210 Nicotine dependence, cigarettes, uncomplicated: Secondary | ICD-10-CM | POA: Diagnosis not present

## 2014-10-27 DIAGNOSIS — M25561 Pain in right knee: Secondary | ICD-10-CM | POA: Diagnosis present

## 2014-10-27 DIAGNOSIS — M2241 Chondromalacia patellae, right knee: Secondary | ICD-10-CM | POA: Diagnosis not present

## 2014-10-27 HISTORY — PX: CHONDROPLASTY: SHX5177

## 2014-10-27 HISTORY — PX: KNEE ARTHROSCOPY: SHX127

## 2014-10-27 LAB — POCT HEMOGLOBIN-HEMACUE: HEMOGLOBIN: 12.9 g/dL (ref 12.0–15.0)

## 2014-10-27 SURGERY — ARTHROSCOPY, KNEE
Anesthesia: General | Site: Knee | Laterality: Right

## 2014-10-27 MED ORDER — BUPIVACAINE HCL (PF) 0.5 % IJ SOLN
INTRAMUSCULAR | Status: DC | PRN
  Administered 2014-10-27: 20 mL

## 2014-10-27 MED ORDER — FENTANYL CITRATE (PF) 100 MCG/2ML IJ SOLN
INTRAMUSCULAR | Status: AC
  Filled 2014-10-27: qty 6

## 2014-10-27 MED ORDER — GLYCOPYRROLATE 0.2 MG/ML IJ SOLN: 0.2000 mg | Freq: Once | INTRAMUSCULAR | Status: DC | PRN

## 2014-10-27 MED ORDER — SODIUM CHLORIDE 0.9 % IR SOLN
Status: DC | PRN
  Administered 2014-10-27: 900 mL

## 2014-10-27 MED ORDER — LIDOCAINE HCL (CARDIAC) 20 MG/ML IV SOLN
INTRAVENOUS | Status: DC | PRN
  Administered 2014-10-27: 50 mg via INTRAVENOUS

## 2014-10-27 MED ORDER — LACTATED RINGERS IV SOLN
INTRAVENOUS | Status: DC
  Administered 2014-10-27 (×2): via INTRAVENOUS

## 2014-10-27 MED ORDER — CEFAZOLIN SODIUM-DEXTROSE 2-3 GM-% IV SOLR
INTRAVENOUS | Status: AC
  Filled 2014-10-27: qty 50

## 2014-10-27 MED ORDER — PROPOFOL 10 MG/ML IV BOLUS
INTRAVENOUS | Status: DC | PRN
  Administered 2014-10-27: 200 mg via INTRAVENOUS

## 2014-10-27 MED ORDER — FENTANYL CITRATE (PF) 100 MCG/2ML IJ SOLN: 25.0000 ug | INTRAMUSCULAR | Status: DC | PRN

## 2014-10-27 MED ORDER — CHLORHEXIDINE GLUCONATE 4 % EX LIQD: 60.0000 mL | Freq: Once | CUTANEOUS | Status: DC

## 2014-10-27 MED ORDER — MIDAZOLAM HCL 2 MG/2ML IJ SOLN
INTRAMUSCULAR | Status: AC
  Filled 2014-10-27: qty 2

## 2014-10-27 MED ORDER — FENTANYL CITRATE (PF) 100 MCG/2ML IJ SOLN
50.0000 ug | INTRAMUSCULAR | Status: AC | PRN
  Administered 2014-10-27: 100 ug via INTRAVENOUS
  Administered 2014-10-27 (×2): 50 ug via INTRAVENOUS

## 2014-10-27 MED ORDER — MIDAZOLAM HCL 2 MG/2ML IJ SOLN
1.0000 mg | INTRAMUSCULAR | Status: DC | PRN
  Administered 2014-10-27: 2 mg via INTRAVENOUS

## 2014-10-27 MED ORDER — CEFAZOLIN SODIUM-DEXTROSE 2-3 GM-% IV SOLR
2.0000 g | INTRAVENOUS | Status: AC
  Administered 2014-10-27: 2 g via INTRAVENOUS

## 2014-10-27 MED ORDER — SCOPOLAMINE 1 MG/3DAYS TD PT72: 1.0000 | MEDICATED_PATCH | Freq: Once | TRANSDERMAL | Status: DC | PRN

## 2014-10-27 MED ORDER — OXYCODONE-ACETAMINOPHEN 5-325 MG PO TABS: 1.0000 | ORAL_TABLET | Freq: Four times a day (QID) | ORAL | Status: DC | PRN

## 2014-10-27 MED ORDER — DEXAMETHASONE SODIUM PHOSPHATE 4 MG/ML IJ SOLN
INTRAMUSCULAR | Status: DC | PRN
  Administered 2014-10-27: 10 mg via INTRAVENOUS

## 2014-10-27 MED ORDER — PROPOFOL 10 MG/ML IV BOLUS
INTRAVENOUS | Status: AC
  Filled 2014-10-27: qty 20

## 2014-10-27 MED ORDER — ONDANSETRON HCL 4 MG/2ML IJ SOLN
INTRAMUSCULAR | Status: DC | PRN
  Administered 2014-10-27: 4 mg via INTRAVENOUS

## 2014-10-27 MED ORDER — ONDANSETRON HCL 4 MG/2ML IJ SOLN: 4.0000 mg | Freq: Once | INTRAMUSCULAR | Status: DC | PRN

## 2014-10-27 SURGICAL SUPPLY — 42 items
BANDAGE ELASTIC 6 VELCRO ST LF (GAUZE/BANDAGES/DRESSINGS) ×3 IMPLANT
BLADE 4.2CUDA (BLADE) IMPLANT
BLADE GREAT WHITE 4.2 (BLADE) ×2 IMPLANT
BLADE GREAT WHITE 4.2MM (BLADE) ×1
CUTTER MENISCUS  4.2MM (BLADE)
CUTTER MENISCUS 4.2MM (BLADE) IMPLANT
DRAPE ARTHROSCOPY W/POUCH 114 (DRAPES) ×3 IMPLANT
DRSG EMULSION OIL 3X3 NADH (GAUZE/BANDAGES/DRESSINGS) ×3 IMPLANT
DURAPREP 26ML APPLICATOR (WOUND CARE) ×3 IMPLANT
ELECT MENISCUS 165MM 90D (ELECTRODE) IMPLANT
ELECT REM PT RETURN 9FT ADLT (ELECTROSURGICAL)
ELECTRODE REM PT RTRN 9FT ADLT (ELECTROSURGICAL) IMPLANT
GAUZE SPONGE 4X4 12PLY STRL (GAUZE/BANDAGES/DRESSINGS) ×3 IMPLANT
GLOVE BIO SURGEON STRL SZ 6.5 (GLOVE) ×1 IMPLANT
GLOVE BIO SURGEONS STRL SZ 6.5 (GLOVE) ×1
GLOVE BIOGEL PI IND STRL 7.0 (GLOVE) IMPLANT
GLOVE BIOGEL PI IND STRL 8 (GLOVE) ×2 IMPLANT
GLOVE BIOGEL PI INDICATOR 7.0 (GLOVE) ×6
GLOVE BIOGEL PI INDICATOR 8 (GLOVE) ×4
GLOVE ECLIPSE 6.5 STRL STRAW (GLOVE) ×2 IMPLANT
GLOVE ECLIPSE 7.5 STRL STRAW (GLOVE) ×6 IMPLANT
GOWN STRL REUS W/ TWL LRG LVL3 (GOWN DISPOSABLE) ×1 IMPLANT
GOWN STRL REUS W/ TWL XL LVL3 (GOWN DISPOSABLE) ×1 IMPLANT
GOWN STRL REUS W/TWL LRG LVL3 (GOWN DISPOSABLE) ×6
GOWN STRL REUS W/TWL XL LVL3 (GOWN DISPOSABLE) ×6 IMPLANT
HOLDER KNEE FOAM BLUE (MISCELLANEOUS) ×3 IMPLANT
KNEE WRAP E Z 3 GEL PACK (MISCELLANEOUS) ×3 IMPLANT
MANIFOLD NEPTUNE II (INSTRUMENTS) ×2 IMPLANT
NDL SAFETY ECLIPSE 18X1.5 (NEEDLE) IMPLANT
NEEDLE HYPO 18GX1.5 SHARP (NEEDLE)
PACK ARTHROSCOPY DSU (CUSTOM PROCEDURE TRAY) ×3 IMPLANT
PACK BASIN DAY SURGERY FS (CUSTOM PROCEDURE TRAY) ×3 IMPLANT
PAD CAST 4YDX4 CTTN HI CHSV (CAST SUPPLIES) ×1 IMPLANT
PADDING CAST COTTON 4X4 STRL (CAST SUPPLIES) ×3
PENCIL BUTTON HOLSTER BLD 10FT (ELECTRODE) IMPLANT
SET ARTHROSCOPY TUBING (MISCELLANEOUS) ×3
SET ARTHROSCOPY TUBING LN (MISCELLANEOUS) ×1 IMPLANT
SUT ETHILON 4 0 PS 2 18 (SUTURE) IMPLANT
SYR 5ML LL (SYRINGE) ×3 IMPLANT
TOWEL OR 17X24 6PK STRL BLUE (TOWEL DISPOSABLE) ×3 IMPLANT
TOWEL OR NON WOVEN STRL DISP B (DISPOSABLE) ×1 IMPLANT
WATER STERILE IRR 1000ML POUR (IV SOLUTION) ×3 IMPLANT

## 2014-10-27 NOTE — Anesthesia Procedure Notes (Signed)
Procedure Name: LMA Insertion Date/Time: 10/27/2014 11:23 AM Performed by: Caren MacadamARTER, Merlina Marchena W Pre-anesthesia Checklist: Patient identified, Emergency Drugs available, Suction available and Patient being monitored Patient Re-evaluated:Patient Re-evaluated prior to inductionOxygen Delivery Method: Circle System Utilized Preoxygenation: Pre-oxygenation with 100% oxygen Intubation Type: IV induction Ventilation: Mask ventilation without difficulty LMA: LMA inserted LMA Size: 4.0 Number of attempts: 1 Airway Equipment and Method: Bite block Placement Confirmation: positive ETCO2 and breath sounds checked- equal and bilateral Tube secured with: Tape Dental Injury: Teeth and Oropharynx as per pre-operative assessment

## 2014-10-27 NOTE — Brief Op Note (Signed)
10/27/2014  12:01 PM  PATIENT:  Brandy Hansen  [age] y.o. 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, LOOSE BODY  PROCEDURE:  Procedure(s): ARTHROSCOPY KNEE with medial femeral condyl and femoral patella joint (Right)  SURGEON:  Surgeon(s) and Role:    * Jodi GeraldsJohn Caide Campi, MD - Primary  PHYSICIAN ASSISTANT:   ASSISTANTS: bethune   ANESTHESIA:   general  EBL:  Total I/O In: 1200 [I.V.:1200] Out: -   BLOOD ADMINISTERED:none  DRAINS: none   LOCAL MEDICATIONS USED:  MARCAINE     SPECIMEN:  No Specimen  DISPOSITION OF SPECIMEN:  N/A  COUNTS:  YES  TOURNIQUET:  * No tourniquets in log *  DICTATION: .Other Dictation: Dictation Number missed it  PLAN OF CARE: Discharge to home after PACU  PATIENT DISPOSITION:  PACU - hemodynamically stable.   Delay start of Pharmacological VTE agent (>24hrs) due to surgical blood loss or risk of bleeding: no

## 2014-10-27 NOTE — Discharge Instructions (Signed)
Post Anesthesia Home Care Instructions  Activity: Get plenty of rest for the remainder of the day. A responsible adult should stay with you for 24 hours following the procedure.  For the next 24 hours, DO NOT: -Drive a car -Advertising copywriterperate machinery -Drink alcoholic beverages -Take any medication unless instructed by your physician -Make any legal decisions or sign important papers.  Meals: Start with liquid foods such as gelatin or soup. Progress to regular foods as tolerated. Avoid greasy, spicy, heavy foods. If nausea and/or vomiting occur, drink only clear liquids until the nausea and/or vomiting subsides. Call your physician if vomiting continues.  Special Instructions/Symptoms: Your throat may feel dry or sore from the anesthesia or the breathing tube placed in your throat during surgery. If this causes discomfort, gargle with warm salt water. The discomfort should disappear within 24 hours.  If you had a scopolamine patch placed behind your ear for the management of post- operative nausea and/or vomiting:  1. The medication in the patch is effective for 72 hours, after which it should be removed.  Wrap patch in a tissue and discard in the trash. Wash hands thoroughly with soap and water. 2. You may remove the patch earlier than 72 hours if you experience unpleasant side effects which may include dry mouth, dizziness or visual disturbances. 3. Avoid touching the patch. Wash your hands with soap and water after contact with the patch.   POST-OP KNEE ARTHROSCOPY INSTRUCTIONS  Dr. Cherlyn RobertsJohn Graves/Jim Beatriz Quintela PA-C  Pain You will be expected to have a moderate amount of pain in the affected knee for approximately two weeks. However, the first two days will be the most severe pain. A prescription has been provided to take as needed for the pain. The pain can be reduced by applying ice packs to the knee for the first 1-2 weeks post surgery. Also, keeping the leg elevated on pillows will help  alleviate the pain. If you develop any acute pain or swelling in your calf muscle, please call the doctor.  Activity It is preferred that you stay at bed rest for approximately 24 hours. However, you may go to the bathroom with help. Weight bearing as tolerated. You may begin the knee exercises the day of surgery. Discontinue crutches as the knee pain resolves.  Dressing Keep the dressing dry. If the ace bandage should wrinkle or roll up, this can be rewrapped to prevent ridges in the bandage. You may remove all dressings in 48 hours,  apply bandaids to each wound. You may shower on the 4th day after surgery but no tub bath.  Symptoms to report to your doctor Extreme pain Extreme swelling Temperature above 101 degrees Change in the feeling, color, or movement of your toes Redness, heat, or swelling at your incision  Exercise If is preferred that as soon as possible you try to do a straight leg raise without bending the knee and concentrate on bringing the heel of your foot off the bed up to approximately 45 degrees and hold for the count of 10 seconds. Repeat this at least 10 times three or four times per day. Additional exercises are provided below.  You are encouraged to bend the knee as tolerated.  Follow-Up Call to schedule a follow-up appointment in 5-7 days. Our office # is [phone].  POST-OP EXERCISES  Short Arc Quads  1. Lie on back with legs straight. Place towel roll under thigh, just above knee. 2. Tighten thigh muscles to straighten knee and lift heel off bed.  bed. 3. Hold for slow count of five, then lower. 4. Do three sets of ten    Straight Leg Raises  1. Lie on back with operative leg straight and other leg bent. 2. Keeping operative leg completely straight, slowly lift operative leg so foot is 5 inches off bed. 3. Hold for slow count of five, then lower. 4. Do three sets of ten.    DO BOTH EXERCISES 2 TIMES A DAY  Ankle Pumps  Work/move the operative ankle  and foot up and down 10 times every hour while awake.  

## 2014-10-27 NOTE — Transfer of Care (Signed)
Immediate Anesthesia Transfer of Care Note  Patient: Brandy BraverRebecca A Hansen  Procedure(s) Performed: Procedure(s): ARTHROSCOPY KNEE with chondroplasty of medial femeral condyle and femoral patella joint (Right) CHONDROPLASTY (Right)  Patient Location: PACU  Anesthesia Type:General  Level of Consciousness: awake and alert   Airway & Oxygen Therapy: Patient Spontanous Breathing and Patient connected to face mask oxygen  Post-op Assessment: Report given to RN and Post -op Vital signs reviewed and stable  Post vital signs: Reviewed and stable  Last Vitals:  Filed Vitals:   10/27/14 0926  BP: 124/79  Pulse: 101  Temp: 36.6 C  Resp: 20    Complications: No apparent anesthesia complications

## 2014-10-27 NOTE — H&P (Signed)
PREOPERATIVE H&P  Chief Complaint: r knee pain  HPI: Brandy Hansen is a 29 y.o. female who presents for evaluation of r knee pain. It has been present for greater than 3 months and has been worsening. She has failed conservative measures. Pain is rated as moderate.  Past Medical History  Diagnosis Date  . Mental disorder   . Depression   . Anxiety   . Bipolar 1 disorder   . PONV (postoperative nausea and vomiting)   . Chronic kidney disease     ? problems as child, "stretched" fine since  . Ovarian cyst   . HPV (human papilloma virus) anogenital infection    Past Surgical History  Procedure Laterality Date  . Wrist surgery    . Laparoscopy  06/24/2011    Procedure: LAPAROSCOPY OPERATIVE;  Surgeon: Lesly DukesKelly H. Leggett, MD;  Location: [facility];  Service: Gynecology;  Laterality: Right;  operative laparoscopy and right salpingectomy with removal of ectopic pregnancy   History   Social History  . Marital Status: Married    Spouse Name: N/A  . Number of Children: N/A  . Years of Education: N/A   Social History Main Topics  . Smoking status: Current Every Day Smoker    Types: Cigarettes  . Smokeless tobacco: Never Used  . Alcohol Use: No  . Drug Use: No  . Sexual Activity: Yes   Other Topics Concern  . None   Social History Narrative   Family History  Problem Relation Age of Onset  . Anesthesia problems Neg Hx   . Diabetes Mother   . Hypertension Father    Allergies  Allergen Reactions  . Codeine Itching  . Naproxen Nausea And Vomiting and Rash  . Sulfa Antibiotics Rash   Prior to Admission medications   Medication Sig Start Date End Date Taking? Authorizing Provider  QUEtiapine (SEROQUEL) 100 MG tablet Take 300 mg by mouth at bedtime.   Yes Historical Provider, MD     Positive ROS: none  All other systems have been reviewed and were otherwise negative with the exception of those mentioned in the HPI and as above.  Physical Exam: Filed Vitals:   10/27/14  0926  BP: 124/79  Pulse: 101  Temp: 97.9 F (36.6 C)  Resp: 20    General: Alert, no acute distress Cardiovascular: No pedal edema Respiratory: No cyanosis, no use of accessory musculature GI: No organomegaly, abdomen is soft and non-tender Skin: No lesions in the area of chief complaint Neurologic: Sensation intact distally Psychiatric: Patient is competent for consent with normal mood and affect Lymphatic: No axillary or cervical lymphadenopathy  MUSCULOSKELETAL: r knee pain on rom +grind and inhibition  Assessment/Plan: RIGHT KNEE PAIN AFTER TRAUMA, 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  Predicted outcome is good, although there will be at least a six to nine month expected recovery.  Tamie Minteer L, MD 10/27/2014 11:12 AM

## 2014-10-27 NOTE — Anesthesia Postprocedure Evaluation (Signed)
  Anesthesia Post-op Note  Patient: Brandy BraverRebecca A Hansen  Procedure(s) Performed: Procedure(s): ARTHROSCOPY KNEE with chondroplasty of medial femeral condyle and femoral patella joint (Right) CHONDROPLASTY (Right)  Patient Location: PACU  Anesthesia Type:General  Level of Consciousness: awake, alert  and oriented  Airway and Oxygen Therapy: Patient Spontanous Breathing  Post-op Pain: mild  Post-op Assessment: Post-op Vital signs reviewed, Patient's Cardiovascular Status Stable, Respiratory Function Stable and Patent Airway     RLE Motor Response: Purposeful movement RLE Sensation: No pain, Decreased      Post-op Vital Signs: stable  Last Vitals:  Filed Vitals:   10/27/14 1245  BP: 119/81  Pulse: 92  Temp:   Resp: 16    Complications: No apparent anesthesia complications

## 2014-10-27 NOTE — Anesthesia Preprocedure Evaluation (Signed)
Anesthesia Evaluation  Patient identified by MRN, date of birth, ID band Patient awake    Reviewed: Allergy & Precautions, NPO status , Patient's Chart, lab work & pertinent test results  Airway Mallampati: II  TM Distance: >3 FB Neck ROM: Full    Dental  (+) Teeth Intact, Dental Advisory Given   Pulmonary Current Smoker,  breath sounds clear to auscultation        Cardiovascular Rhythm:Regular Rate:Normal     Neuro/Psych    GI/Hepatic   Endo/Other    Renal/GU      Musculoskeletal   Abdominal   Peds  Hematology   Anesthesia Other Findings   Reproductive/Obstetrics                             Anesthesia Physical Anesthesia Plan  ASA: II  Anesthesia Plan: General   Post-op Pain Management:    Induction: Intravenous  Airway Management Planned: LMA  Additional Equipment:   Intra-op Plan:   Post-operative Plan:   Informed Consent: I have reviewed the patients History and Physical, chart, labs and discussed the procedure including the risks, benefits and alternatives for the proposed anesthesia with the patient or authorized representative who has indicated his/her understanding and acceptance.   Dental advisory given  Plan Discussed with: CRNA and Anesthesiologist  Anesthesia Plan Comments:         Anesthesia Quick Evaluation  

## 2014-10-28 ENCOUNTER — Encounter (HOSPITAL_BASED_OUTPATIENT_CLINIC_OR_DEPARTMENT_OTHER): Payer: Self-pay | Admitting: Orthopedic Surgery

## 2014-10-28 NOTE — Op Note (Signed)
NAMLynnda Shields:  Molesworth, Piera             ACCOUNT NO.:  [id number]  MEDICAL RECORD NO.:  [id number]  LOCATION:                               FACILITY:  [facility]  PHYSICIAN:  Harvie JuniorJohn L. Danuta Huseman, M.D.   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  POSTOPERATIVE DIAGNOSES: 1. 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 2. 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  PROCEDURE: 1. Chondroplasty of medial femoral condyle and medial tibial plateau     and down to bleeding bone were necessary. 2. Chondroplasty of patellofemoral joint.  SURGEON:  Harvie JuniorJohn L. Elvira Langston, M.D.  ASSISTANT:  Marshia LyJames Bethune, P.A.  ANESTHESIA:  General.  BRIEF HISTORY:  Brandy Hansen is a 29 year old female with a history of having significant complaints of right knee pain.  She has been treated conservatively for prolonged period of time with injection therapy, activity modification and after failure of all conservative care, she was taken to the operating room for operative knee arthroscopy. Preoperative MRI showed patellofemoral degenerative change as well as some questionable loose body.  She was taken to the operating room for this procedure.  DESCRIPTION OF PROCEDURE:  The patient was taken to the operating room. After adequate anesthesia was obtained with general anesthesia, the patient was was placed supine on the operating table.  The right leg was prepped draped in sterile fashion.  Routine arthroscopic examination of knee at this point revealed that there was some chondromalacia of the patellofemoral joint, certainly not severe.  Attention was then turned to the medial compartment were unfortunately there was fairly significant degenerative change on the medial femoral condyle, certainly grade 2  and grade 3 in nature.  The medial tibial plateau also similarly had some grade 2 change. The medial meniscus was normal.  ACL normal. Lateral side normal.  We went back in the popliteal hiatus and back behind the lateral meniscus. We went into the posterior horn of the medial meniscus, both under and posterior and went centrally through the notch and looked laterally, and no identifiable loose bodies were identified.  Back up to the patellofemoral joint with some mild chondromalacia and in the synovium anterior to the knee, there did appear to be some cartilaginous loose bodies that may have been adhered in the synovium.  We cleaned these out and at this point, the knee was copiously and thoroughly irrigated suctioned dry. The arthroscopic portals were closed with bandage.  A sterile compressive dressing was applied and the patient was taken to the recovery room and she was noted to be in satisfactory condition.  The estimated blood loss for the procedure was minimal.     Harvie JuniorJohn L. Florean Hoobler, M.D.   ______________________________ Harvie JuniorJohn L. Malarie Tappen, M.D.    Ranae PlumberJLG/MEDQ  D:  10/27/2014  T:  10/27/2014  Job:  [id number]

## 2015-12-18 ENCOUNTER — Emergency Department (HOSPITAL_COMMUNITY)
Admission: EM | Admit: 2015-12-18 | Discharge: 2015-12-18 | Disposition: A | Discharge status: Home / Self Care | Payer: BLUE CROSS/BLUE SHIELD | Attending: Emergency Medicine | Admitting: Emergency Medicine

## 2015-12-18 ENCOUNTER — Encounter (HOSPITAL_COMMUNITY): Payer: Self-pay | Admitting: *Deleted

## 2015-12-18 ENCOUNTER — Emergency Department (HOSPITAL_COMMUNITY): Payer: BLUE CROSS/BLUE SHIELD

## 2015-12-18 DIAGNOSIS — N946 Dysmenorrhea, unspecified: Secondary | ICD-10-CM | POA: Insufficient documentation

## 2015-12-18 DIAGNOSIS — R102 Pelvic and perineal pain: Secondary | ICD-10-CM

## 2015-12-18 DIAGNOSIS — N189 Chronic kidney disease, unspecified: Secondary | ICD-10-CM | POA: Insufficient documentation

## 2015-12-18 DIAGNOSIS — F17210 Nicotine dependence, cigarettes, uncomplicated: Secondary | ICD-10-CM | POA: Insufficient documentation

## 2015-12-18 LAB — CBC WITH DIFFERENTIAL/PLATELET
BASOS ABS: 0 10*3/uL (ref 0.0–0.1)
BASOS PCT: 0 %
Eosinophils Absolute: 0.1 10*3/uL (ref 0.0–0.7)
Eosinophils Relative: 1 %
HEMATOCRIT: 39.3 % (ref 36.0–46.0)
HEMOGLOBIN: 12.8 g/dL (ref 12.0–15.0)
Lymphocytes Relative: 19 %
Lymphs Abs: 2.2 10*3/uL (ref 0.7–4.0)
MCH: 28.7 pg (ref 26.0–34.0)
MCHC: 32.6 g/dL (ref 30.0–36.0)
MCV: 88.1 fL (ref 78.0–100.0)
Monocytes Absolute: 0.7 10*3/uL (ref 0.1–1.0)
Monocytes Relative: 6 %
NEUTROS ABS: 9 10*3/uL — AB (ref 1.7–7.7)
NEUTROS PCT: 74 %
Platelets: 289 10*3/uL (ref 150–400)
RBC: 4.46 MIL/uL (ref 3.87–5.11)
RDW: 13 % (ref 11.5–15.5)
WBC: 12.1 10*3/uL — ABNORMAL HIGH (ref 4.0–10.5)

## 2015-12-18 LAB — URINALYSIS, ROUTINE W REFLEX MICROSCOPIC
BILIRUBIN URINE: NEGATIVE
Glucose, UA: NEGATIVE mg/dL
Ketones, ur: NEGATIVE mg/dL
Leukocytes, UA: NEGATIVE
NITRITE: NEGATIVE
PH: 5.5 (ref 5.0–8.0)
Protein, ur: NEGATIVE mg/dL
SPECIFIC GRAVITY, URINE: 1.01 (ref 1.005–1.030)

## 2015-12-18 LAB — URINE MICROSCOPIC-ADD ON

## 2015-12-18 LAB — COMPREHENSIVE METABOLIC PANEL
ALBUMIN: 4.4 g/dL (ref 3.5–5.0)
ALK PHOS: 69 U/L (ref 38–126)
ALT: 27 U/L (ref 14–54)
AST: 25 U/L (ref 15–41)
Anion gap: 7 (ref 5–15)
BUN: 10 mg/dL (ref 6–20)
CO2: 21 mmol/L — ABNORMAL LOW (ref 22–32)
Calcium: 8.9 mg/dL (ref 8.9–10.3)
Chloride: 104 mmol/L (ref 101–111)
Creatinine, Ser: 0.78 mg/dL (ref 0.44–1.00)
GFR calc Af Amer: >60 mL/min (ref >60)
GFR calc non Af Amer: >60 mL/min (ref >60)
Glucose, Bld: 100 mg/dL — ABNORMAL HIGH (ref 65–99)
POTASSIUM: 3.5 mmol/L (ref 3.5–5.1)
Sodium: 132 mmol/L — ABNORMAL LOW (ref 135–145)
Total Bilirubin: 0.3 mg/dL (ref 0.3–1.2)
Total Protein: 7.3 g/dL (ref 6.5–8.1)

## 2015-12-18 LAB — I-STAT BETA HCG BLOOD, ED (MC, WL, AP ONLY): I-stat hCG, quantitative: <5 m[IU]/mL (ref <5)

## 2015-12-18 MED ORDER — HYDROMORPHONE HCL 1 MG/ML IJ SOLN
0.5000 mg | INTRAMUSCULAR | Status: DC | PRN
  Administered 2015-12-18: 0.5 mg via INTRAVENOUS
  Filled 2015-12-18: qty 1

## 2015-12-18 MED ORDER — SODIUM CHLORIDE 0.9 % IV SOLN: INTRAVENOUS | Status: DC

## 2015-12-18 MED ORDER — ONDANSETRON HCL 4 MG/2ML IJ SOLN
4.0000 mg | Freq: Once | INTRAMUSCULAR | Status: AC
  Administered 2015-12-18: 4 mg via INTRAVENOUS
  Filled 2015-12-18: qty 2

## 2015-12-18 MED ORDER — SODIUM CHLORIDE 0.9 % IV BOLUS (SEPSIS)
1000.0000 mL | Freq: Once | INTRAVENOUS | Status: AC
  Administered 2015-12-18: 1000 mL via INTRAVENOUS

## 2015-12-18 NOTE — ED Notes (Signed)
Patient transported to Ultrasound 

## 2015-12-18 NOTE — ED Provider Notes (Signed)
[facility]PT Provider Note   CSN: [id number] Arrival date & time: 12/18/15  4098     History   Chief Complaint Chief Complaint  Patient presents with  . Pelvic Pain    HPI Brandy Hansen is a 30 y.o. female.  HPI Pt had sudden onset of sharp pain in the left pelvic region.  It started about 45 minutes ago.  The pain is radiating into the rectal area.  The pain is severe. No fevers or vomiting.  No dysuria.  She has history of a ruptured ectopic.  She is not pregnant as far as she knows.  She started her menstrual period today.   Past Medical History:  Diagnosis Date  . Anxiety   . Bipolar 1 disorder (HCC)   . Chronic kidney disease    ? problems as child, "stretched" fine since  . Depression   . HPV (human papilloma virus) anogenital infection   . Mental disorder   . Ovarian cyst   . PONV (postoperative nausea and vomiting)     Patient Active Problem List   Diagnosis Date Noted  . History of ectopic pregnancy 07/19/2011    Past Surgical History:  Procedure Laterality Date  . CHONDROPLASTY Right 10/27/2014   Procedure: CHONDROPLASTY;  Surgeon: Jodi Geralds, MD;  Location: [facility];  Service: Orthopedics;  Laterality: Right;  . KNEE ARTHROSCOPY Right 10/27/2014   Procedure: ARTHROSCOPY KNEE with chondroplasty of medial femeral condyle and femoral patella joint;  Surgeon: Jodi Geralds, MD;  Location: [facility];  Service: Orthopedics;  Laterality: Right;  . LAPAROSCOPY  06/24/2011   Procedure: LAPAROSCOPY OPERATIVE;  Surgeon: Lesly Dukes, MD;  Location: [facility];  Service: Gynecology;  Laterality: Right;  operative laparoscopy and right salpingectomy with removal of ectopic pregnancy  . WRIST SURGERY      OB History    Gravida Para Term Preterm AB Living   2 1 1   1 1    SAB TAB Ectopic Multiple Live Births       1           Home Medications    Prior to Admission medications   Medication Sig Start Date End Date Taking?  Authorizing Provider  QUEtiapine (SEROQUEL) 100 MG tablet Take 300 mg by mouth at bedtime.   Yes Historical Provider, MD    Family History Family History  Problem Relation Age of Onset  . Diabetes Mother   . Hypertension Father   . Anesthesia problems Neg Hx     Social History Social History  Substance Use Topics  . Smoking status: Current Every Day Smoker    Types: Cigarettes  . Smokeless tobacco: Never Used  . Alcohol use Yes     Comment: rarely     Allergies   Codeine; Naproxen; and Sulfa antibiotics   Review of Systems Review of Systems  All other systems reviewed and are negative.    Physical Exam Updated Vital Signs BP 112/73 (BP Location: Left Arm)   Pulse 86   Temp 97.2 F (36.2 C) (Oral)   Resp 18   Wt 84.9 kg   LMP 12/18/2015   SpO2 98%   BMI 30.19 kg/m   Physical Exam  Constitutional: She appears well-developed and well-nourished. No distress.  HENT:  Head: Normocephalic and atraumatic.  Right Ear: External ear normal.  Left Ear: External ear normal.  Eyes: Conjunctivae are normal. Right eye exhibits no discharge. Left eye exhibits no discharge. No scleral icterus.  Neck: Neck supple. No tracheal deviation present.  Cardiovascular: Normal rate, regular rhythm and intact distal pulses.   Pulmonary/Chest: Effort normal and breath sounds normal. No stridor. No respiratory distress. She has no wheezes. She has no rales.  Abdominal: Soft. Bowel sounds are normal. She exhibits no distension. There is tenderness in the suprapubic area and left lower quadrant. There is no rebound and no guarding. Hernia confirmed negative in the right inguinal area and confirmed negative in the left inguinal area.  Genitourinary: There is no lesion or injury on the right labia. There is no lesion or injury on the left labia. Uterus is not tender. Cervix exhibits no motion tenderness. Right adnexum displays no mass, no tenderness and no fullness. Left adnexum displays  tenderness. Left adnexum displays no mass. There is bleeding (no clots) in the vagina.  Musculoskeletal: She exhibits no edema or tenderness.  Neurological: She is alert. She has normal strength. No cranial nerve deficit (no facial droop, extraocular movements intact, no slurred speech) or sensory deficit. She exhibits normal muscle tone. She displays no seizure activity. Coordination normal.  Skin: Skin is warm and dry. No rash noted.  Psychiatric: She has a normal mood and affect.  Nursing note and vitals reviewed.    ED Treatments / Results  Labs (all labs ordered are listed, but only abnormal results are displayed) Labs Reviewed  CBC WITH DIFFERENTIAL/PLATELET - Abnormal; Notable for the following:       Result Value   WBC 12.1 (*)    Neutro Abs 9.0 (*)    All other components within normal limits  COMPREHENSIVE METABOLIC PANEL - Abnormal; Notable for the following:    Sodium 132 (*)    CO2 21 (*)    Glucose, Bld 100 (*)    All other components within normal limits  URINALYSIS, ROUTINE W REFLEX MICROSCOPIC (NOT AT [facility]) - Abnormal; Notable for the following:    Hgb urine dipstick LARGE (*)    All other components within normal limits  URINE MICROSCOPIC-ADD ON - Abnormal; Notable for the following:    Squamous Epithelial / LPF 0-5 (*)    Bacteria, UA RARE (*)    All other components within normal limits  RPR  HIV ANTIBODY (ROUTINE TESTING)  I-STAT BETA HCG BLOOD, ED ([facility], [facility], [facility] ONLY)  GC/CHLAMYDIA PROBE AMP ([place]) NOT AT [facility]     Radiology US Transvaginal Non-ob  Result Date: 12/18/2015 CLINICAL DATA:  Left-sided pelvic pain starting today. EXAM: TRANSABDOMINAL AND TRANSVAGINAL ULTRASOUND OF PELVIS DOPPLER ULTRASOUND OF OVARIES TECHNIQUE: Both transabdominal and transvaginal ultrasound examinations of the pelvis were performed. Transabdominal technique was performed for global imaging of the pelvis including uterus, ovaries, adnexal regions, and pelvic cul-de-sac. It  was necessary to proceed with endovaginal exam following the transabdominal exam to visualize the endometrium and ovaries. Color and duplex Doppler ultrasound was utilized to evaluate blood flow to the ovaries. COMPARISON:  None. FINDINGS: Uterus Measurements: 8.1 x 3.8 x 4 cm. Uterus is slightly anteverted. No fibroids or other mass visualized. Small nabothian cysts in the cervix. Endometrium Thickness: 7 mm.  No focal abnormality visualized. Right ovary Measurements: 3.7 x 2 x 1.8 cm. Normal follicular changes. Normal appearance/no adnexal mass. Left ovary Measurements: 4.1 x 2.5 x 4.1 cm. Normal follicular changes. Normal appearance/no adnexal mass. Pulsed Doppler evaluation of both ovaries demonstrates normal low-resistance arterial and venous waveforms. Flow is demonstrated within both ovaries on color flow Doppler imaging. Other findings No abnormal free fluid. IMPRESSION: Normal  ultrasound appearance of the uterus and ovaries. No evidence of ovarian mass or torsion. Electronically Signed   By: Burman Nieves M.D.   On: 12/18/2015 21:46   US Pelvis Complete  Result Date: 12/18/2015 CLINICAL DATA:  Left-sided pelvic pain starting today. EXAM: TRANSABDOMINAL AND TRANSVAGINAL ULTRASOUND OF PELVIS DOPPLER ULTRASOUND OF OVARIES TECHNIQUE: Both transabdominal and transvaginal ultrasound examinations of the pelvis were performed. Transabdominal technique was performed for global imaging of the pelvis including uterus, ovaries, adnexal regions, and pelvic cul-de-sac. It was necessary to proceed with endovaginal exam following the transabdominal exam to visualize the endometrium and ovaries. Color and duplex Doppler ultrasound was utilized to evaluate blood flow to the ovaries. COMPARISON:  None. FINDINGS: Uterus Measurements: 8.1 x 3.8 x 4 cm. Uterus is slightly anteverted. No fibroids or other mass visualized. Small nabothian cysts in the cervix. Endometrium Thickness: 7 mm.  No focal abnormality visualized.  Right ovary Measurements: 3.7 x 2 x 1.8 cm. Normal follicular changes. Normal appearance/no adnexal mass. Left ovary Measurements: 4.1 x 2.5 x 4.1 cm. Normal follicular changes. Normal appearance/no adnexal mass. Pulsed Doppler evaluation of both ovaries demonstrates normal low-resistance arterial and venous waveforms. Flow is demonstrated within both ovaries on color flow Doppler imaging. Other findings No abnormal free fluid. IMPRESSION: Normal ultrasound appearance of the uterus and ovaries. No evidence of ovarian mass or torsion. Electronically Signed   By: Burman Nieves M.D.   On: 12/18/2015 21:46   [place] Art/ven Flow Abd Pelv Doppler  Result Date: 12/18/2015 CLINICAL DATA:  Left-sided pelvic pain starting today. EXAM: TRANSABDOMINAL AND TRANSVAGINAL ULTRASOUND OF PELVIS DOPPLER ULTRASOUND OF OVARIES TECHNIQUE: Both transabdominal and transvaginal ultrasound examinations of the pelvis were performed. Transabdominal technique was performed for global imaging of the pelvis including uterus, ovaries, adnexal regions, and pelvic cul-de-sac. It was necessary to proceed with endovaginal exam following the transabdominal exam to visualize the endometrium and ovaries. Color and duplex Doppler ultrasound was utilized to evaluate blood flow to the ovaries. COMPARISON:  None. FINDINGS: Uterus Measurements: 8.1 x 3.8 x 4 cm. Uterus is slightly anteverted. No fibroids or other mass visualized. Small nabothian cysts in the cervix. Endometrium Thickness: 7 mm.  No focal abnormality visualized. Right ovary Measurements: 3.7 x 2 x 1.8 cm. Normal follicular changes. Normal appearance/no adnexal mass. Left ovary Measurements: 4.1 x 2.5 x 4.1 cm. Normal follicular changes. Normal appearance/no adnexal mass. Pulsed Doppler evaluation of both ovaries demonstrates normal low-resistance arterial and venous waveforms. Flow is demonstrated within both ovaries on color flow Doppler imaging. Other findings No abnormal free fluid.  IMPRESSION: Normal ultrasound appearance of the uterus and ovaries. No evidence of ovarian mass or torsion. Electronically Signed   By: Burman Nieves M.D.   On: 12/18/2015 21:46    Procedures Procedures (including critical care time)  Medications Ordered in ED Medications  sodium chloride 0.9 % bolus 1,000 mL (1,000 mLs Intravenous New Bag/Given 12/18/15 2003)    And  0.9 %  sodium chloride infusion (not administered)  HYDROmorphone (DILAUDID) injection 0.5 mg (0.5 mg Intravenous Given 12/18/15 2003)  ondansetron (ZOFRAN) injection 4 mg (4 mg Intravenous Given 12/18/15 2002)     Initial Impression / Assessment and Plan / ED Course  I have reviewed the triage vital signs and the nursing notes.  Pertinent labs & imaging results that were available during my care of the patient were reviewed by me and considered in my medical decision making (see chart for details).  Clinical Course    Labs  and ultrasound are reassuring.  No signs of torsion.  No ovarian cyst.  Suspect dysmenorrhea.  Follow up with OB GYN  Final Clinical Impressions(s) / ED Diagnoses   Final diagnoses:  Pelvic pain in female  Dysmenorrhea    New Prescriptions New Prescriptions   No medications on file     Linwood DibblesJon Yohann Curl, MD 12/18/15 2250

## 2015-12-18 NOTE — ED Triage Notes (Signed)
Patient presents with c/o left sided pain that started when she was pushing her sons bike.  Stated it feels like it did when she had a ruptured fallopian tube in 2013

## 2015-12-18 NOTE — ED Notes (Signed)
Patient returned from Ultrasound. 

## 2015-12-18 NOTE — Discharge Instructions (Signed)
Take over-the-counter pain medications as needed for pain, follow-up with your primary care doctor or gynecologist

## 2015-12-19 LAB — HIV ANTIBODY (ROUTINE TESTING W REFLEX): HIV Screen 4th Generation wRfx: NONREACTIVE

## 2015-12-19 LAB — GC/CHLAMYDIA PROBE AMP (~~LOC~~) NOT AT ARMC
Chlamydia: NEGATIVE
Neisseria Gonorrhea: NEGATIVE

## 2015-12-19 LAB — RPR: RPR Ser Ql: NONREACTIVE

## 2016-07-03 ENCOUNTER — Other Ambulatory Visit: Payer: Self-pay | Admitting: Obstetrics and Gynecology

## 2016-07-04 LAB — CYTOLOGY - PAP

## 2016-07-21 ENCOUNTER — Encounter (HOSPITAL_COMMUNITY): Payer: Self-pay

## 2016-07-21 ENCOUNTER — Emergency Department (HOSPITAL_COMMUNITY)
Admission: EM | Admit: 2016-07-21 | Discharge: 2016-07-21 | Disposition: A | Discharge status: Home / Self Care | Payer: 59 | Attending: Emergency Medicine | Admitting: Emergency Medicine

## 2016-07-21 DIAGNOSIS — O210 Mild hyperemesis gravidarum: Secondary | ICD-10-CM | POA: Diagnosis not present

## 2016-07-21 DIAGNOSIS — Z3A09 9 weeks gestation of pregnancy: Secondary | ICD-10-CM | POA: Insufficient documentation

## 2016-07-21 DIAGNOSIS — F17210 Nicotine dependence, cigarettes, uncomplicated: Secondary | ICD-10-CM | POA: Insufficient documentation

## 2016-07-21 DIAGNOSIS — O99331 Smoking (tobacco) complicating pregnancy, first trimester: Secondary | ICD-10-CM | POA: Insufficient documentation

## 2016-07-21 DIAGNOSIS — O219 Vomiting of pregnancy, unspecified: Secondary | ICD-10-CM | POA: Diagnosis present

## 2016-07-21 DIAGNOSIS — N189 Chronic kidney disease, unspecified: Secondary | ICD-10-CM | POA: Diagnosis not present

## 2016-07-21 LAB — I-STAT BETA HCG BLOOD, ED (MC, WL, AP ONLY)

## 2016-07-21 LAB — BASIC METABOLIC PANEL
Anion gap: 9 (ref 5–15)
BUN: 10 mg/dL (ref 6–20)
CO2: 20 mmol/L — AB (ref 22–32)
Calcium: 8.8 mg/dL — ABNORMAL LOW (ref 8.9–10.3)
Chloride: 106 mmol/L (ref 101–111)
Creatinine, Ser: 0.63 mg/dL (ref 0.44–1.00)
GFR calc Af Amer: >60 mL/min (ref >60)
GFR calc non Af Amer: >60 mL/min (ref >60)
GLUCOSE: 70 mg/dL (ref 65–99)
Potassium: 3.5 mmol/L (ref 3.5–5.1)
SODIUM: 135 mmol/L (ref 135–145)

## 2016-07-21 LAB — URINALYSIS, ROUTINE W REFLEX MICROSCOPIC
BILIRUBIN URINE: NEGATIVE
Glucose, UA: NEGATIVE mg/dL
HGB URINE DIPSTICK: NEGATIVE
Ketones, ur: 80 mg/dL — AB
LEUKOCYTES UA: NEGATIVE
Nitrite: NEGATIVE
PROTEIN: NEGATIVE mg/dL
Specific Gravity, Urine: 1.027 (ref 1.005–1.030)
pH: 5 (ref 5.0–8.0)

## 2016-07-21 MED ORDER — METOCLOPRAMIDE HCL 5 MG/ML IJ SOLN
10.0000 mg | Freq: Once | INTRAMUSCULAR | Status: AC
  Administered 2016-07-21: 10 mg via INTRAVENOUS
  Filled 2016-07-21: qty 2

## 2016-07-21 MED ORDER — DOXYLAMINE-PYRIDOXINE 10-10 MG PO TBEC: 2.0000 | DELAYED_RELEASE_TABLET | Freq: Every day | ORAL | 0 refills | Status: DC

## 2016-07-21 MED ORDER — SODIUM CHLORIDE 0.9 % IV BOLUS (SEPSIS)
1000.0000 mL | Freq: Once | INTRAVENOUS | Status: AC
  Administered 2016-07-21: 1000 mL via INTRAVENOUS

## 2016-07-21 MED ORDER — DIPHENHYDRAMINE HCL 50 MG/ML IJ SOLN
25.0000 mg | Freq: Once | INTRAMUSCULAR | Status: AC
  Administered 2016-07-21: 25 mg via INTRAVENOUS
  Filled 2016-07-21: qty 1

## 2016-07-21 MED ORDER — MECLIZINE HCL 25 MG PO TABS
25.0000 mg | ORAL_TABLET | Freq: Once | ORAL | Status: AC
  Administered 2016-07-21: 25 mg via ORAL
  Filled 2016-07-21: qty 1

## 2016-07-21 MED ORDER — LACTATED RINGERS IV BOLUS (SEPSIS)
1000.0000 mL | Freq: Once | INTRAVENOUS | Status: AC
  Administered 2016-07-21: 1000 mL via INTRAVENOUS

## 2016-07-21 NOTE — ED Provider Notes (Signed)
[facility] Provider Note   CSN: [id number] Arrival date & time: 07/21/16  1438     History   Chief Complaint Chief Complaint  Patient presents with  . Emesis/pregnant    HPI Brandy Hansen is a 31 y.o. female [redacted] weeks gestation presenting with 2 days of emesis, unable to eat or keep any fluids down. She has tried to take her prescribed nausea medicine by mouth at home and hasn't been able to keep down. She explains that the same thing happened with her previous pregnancy. She has called her OB/GYN who recommended to come to the emergency department. She went to urgent care and they sent her over to the emergency department. She denies fever, abdominal pain, dysuria, hematuria, vaginal bleeding, diarrhea, cough, any pain or any other symptoms.  HPI  Past Medical History:  Diagnosis Date  . Anxiety   . Bipolar 1 disorder (HCC)   . Chronic kidney disease    ? problems as child, "stretched" fine since  . Depression   . HPV (human papilloma virus) anogenital infection   . Mental disorder   . Ovarian cyst   . PONV (postoperative nausea and vomiting)     Patient Active Problem List   Diagnosis Date Noted  . History of ectopic pregnancy 07/19/2011    Past Surgical History:  Procedure Laterality Date  . CHONDROPLASTY Right 10/27/2014   Procedure: CHONDROPLASTY;  Surgeon: Jodi Geralds, MD;  Location: [facility];  Service: Orthopedics;  Laterality: Right;  . KNEE ARTHROSCOPY Right 10/27/2014   Procedure: ARTHROSCOPY KNEE with chondroplasty of medial femeral condyle and femoral patella joint;  Surgeon: Jodi Geralds, MD;  Location: [facility];  Service: Orthopedics;  Laterality: Right;  . LAPAROSCOPY  06/24/2011   Procedure: LAPAROSCOPY OPERATIVE;  Surgeon: Lesly Dukes, MD;  Location: [facility];  Service: Gynecology;  Laterality: Right;  operative laparoscopy and right salpingectomy with removal of ectopic pregnancy  . WRIST SURGERY      OB  History    Gravida Para Term Preterm AB Living   SAB TAB Ectopic Multiple Live Births       1           Home Medications    Prior to Admission medications   Medication Sig Start Date End Date Taking? Authorizing Provider  Prenatal Multivit-Min-Fe-FA (PRENATAL VITAMINS PO) Take 1 tablet by mouth daily.   Yes Historical Provider, MD  promethazine (PHENERGAN) 25 MG tablet Take 25 mg by mouth every 4 (four) hours as needed for nausea or vomiting.   Yes Historical Provider, MD  Doxylamine-Pyridoxine 10-10 MG TBEC Take 2 tablets by mouth at bedtime. Doxylamine 10 mg/pyridoxine 10 mg (Diclegis): Initial: Two tablets at bedtime on day 1 and 2; if symptoms persist, take 1 tablet in morning and 2 tablets at bedtime on day 3; if symptoms persist, may increase to 1 tablet in morning, 1 tablet mid-afternoon, and 2 tablets at bedtime on day 4 (maximum: doxylamine 40 mg/pyridoxine 40 mg (4 tablets) per day). 07/21/16   Georgiana Shore, PA-C    Family History Family History  Problem Relation Age of Onset  . Diabetes Mother   . Hypertension Father   . Anesthesia problems Neg Hx     Social History Social History  Substance Use Topics  . Smoking status: Current Every Day Smoker    Types: Cigarettes  . Smokeless tobacco: Never Used  . Alcohol use Yes  Comment: rarely     Allergies   Codeine; Naproxen; and Sulfa antibiotics   Review of Systems Review of Systems  Constitutional: Positive for appetite change. Negative for fever.  HENT: Negative for congestion, ear pain, sore throat and trouble swallowing.   Eyes: Negative for pain and visual disturbance.  Respiratory: Negative for cough, choking, chest tightness, shortness of breath, wheezing and stridor.   Cardiovascular: Negative for chest pain and palpitations.  Gastrointestinal: Positive for nausea and vomiting. Negative for abdominal pain.  Genitourinary: Positive for decreased urine volume. Negative for difficulty  urinating, dysuria, flank pain, frequency, hematuria, pelvic pain, urgency and vaginal bleeding.  Musculoskeletal: Negative for arthralgias, back pain, joint swelling, myalgias, neck pain and neck stiffness.  Skin: Negative for color change, pallor and rash.  Neurological: Negative for dizziness, seizures and syncope.     Physical Exam Updated Vital Signs BP 116/67   Pulse 68   Temp 97.9 F (36.6 C) (Oral)   Resp 18   LMP 05/10/2016   SpO2 97%   Physical Exam  Constitutional: She appears well-developed and well-nourished. No distress.  Afebrile, nontoxic-appearing, appears dry, sitting comfortably in bed in no acute distress  HENT:  Head: Normocephalic and atraumatic.  Mouth/Throat: No oropharyngeal exudate.  Lips are dry and oral mucosa  Eyes: Conjunctivae and EOM are normal. Right eye exhibits no discharge. Left eye exhibits no discharge. No scleral icterus.  Neck: Normal range of motion. Neck supple.  Cardiovascular: Normal rate, regular rhythm and normal heart sounds.   No murmur heard. Pulmonary/Chest: Effort normal and breath sounds normal. No respiratory distress. She has no wheezes. She has no rales. She exhibits no tenderness.  Abdominal: Soft. She exhibits no distension and no mass. There is no tenderness. There is no rebound and no guarding.  Musculoskeletal: She exhibits no edema.  Neurological: She is alert.  Skin: Skin is warm and dry. Capillary refill takes less than 2 seconds. She is not diaphoretic. No pallor.  Psychiatric: She has a normal mood and affect.  Nursing note and vitals reviewed.    ED Treatments / Results  Labs (all labs ordered are listed, but only abnormal results are displayed) Labs Reviewed  BASIC METABOLIC PANEL - Abnormal; Notable for the following:       Result Value   CO2 20 (*)    Calcium 8.8 (*)    All other components within normal limits  URINALYSIS, ROUTINE W REFLEX MICROSCOPIC - Abnormal; Notable for the following:     APPearance HAZY (*)    Ketones, ur 80 (*)    All other components within normal limits  I-STAT BETA HCG BLOOD, ED ([facility], [facility] ONLY) - Abnormal; Notable for the following:    I-stat hCG, quantitative >2,000.0 (*)    All other components within normal limits    EKG  EKG Interpretation None       Radiology No results found.  Procedures Procedures (including critical care time)  Medications Ordered in ED Medications  sodium chloride 0.9 % bolus 1,000 mL (0 mLs Intravenous Stopped 07/21/16 1756)  metoCLOPramide (REGLAN) injection 10 mg (10 mg Intravenous Given 07/21/16 1629)  diphenhydrAMINE (BENADRYL) injection 25 mg (25 mg Intravenous Given 07/21/16 1629)  lactated ringers bolus 1,000 mL (0 mLs Intravenous Stopped 07/21/16 1954)  meclizine (ANTIVERT) tablet 25 mg (25 mg Oral Given 07/21/16 2017)     Initial Impression / Assessment and Plan / ED Course  I have reviewed the triage vital signs and the nursing notes.  Pertinent labs & imaging results that were available during my care of the patient were reviewed by me and considered in my medical decision making (see chart for details).    Patient presenting with emesis gravidarum at [redacted] weeks gestation. Similar episode as she has experienced with previous pregnancy. On exam she appeared dried, cracked lips and dry oral mucosa. Patient was given IV fluids and antiemetics  On reassessment after first liter of fluids, she reported significant improvement and was no longer nauseated but she felt thirsty and felt like she couldn't drink something now.  Ordered a second liter and will fluid challenge her. She was able to keep ginger ale down  On reassessment, she was again nauseated and was given meclizine. She reported only slight improvement and was still nauseated but no emesis while in ED. She wanted to try more ginger ale and crackers. On reassessment, she was able to keep down crackers and ginger ale and was ready to go home. Will  discharge home with diclegis and close follow up with OB/GYN. Patient was discussed with Dr. Erma Heritage who agrees with assessment and plan.  Discussed strict return precautions and advised to return to the emergency department if experiencing any new or worsening symptoms. Instructions were understood and patient agreed with discharge plan.  Final Clinical Impressions(s) / ED Diagnoses   Final diagnoses:  Hyperemesis gravidarum    New Prescriptions Discharge Medication List as of 07/21/2016 10:18 PM    START taking these medications   Details  Doxylamine-Pyridoxine 10-10 MG TBEC Take 2 tablets by mouth at bedtime. Doxylamine 10 mg/pyridoxine 10 mg (Diclegis): Initial: Two tablets at bedtime on day 1 and 2; if symptoms persist, take 1 tablet in morning and 2 tablets at bedtime on day 3; if symptoms persist, may increase to 1 tabl et in morning, 1 tablet mid-afternoon, and 2 tablets at bedtime on day 4 (maximum: doxylamine 40 mg/pyridoxine 40 mg (4 tablets) per day)., Starting Sat 07/21/2016, Print         Georgiana Shore, PA-C 07/22/16 0004    Shaune Pollack, MD 07/22/16 1147

## 2016-07-21 NOTE — Discharge Instructions (Signed)
As discussed, please slowly reintroduce foods starting with blend foods like saltines and building up solid foods as tolerated.  Start with 2 tablets at bedtime. If symptoms persist you may add a tablet in the morning and follow the instructions if you continue to have symptoms.  Follow-up with the OB/GYN on Monday. Return to the emergency department if symptoms worsen or you experience any new concerning symptoms in the meantime.

## 2016-07-21 NOTE — ED Notes (Signed)
Pt understood dc material. NAD noted. Scripts given at dc 

## 2016-07-21 NOTE — ED Notes (Signed)
Taking small amount po fluid.

## 2016-07-21 NOTE — ED Triage Notes (Signed)
Patient complains of persistent nausea and vomiting since Thursday. States that she is [redacted] weeks pregnant and has had same in past with previous pregnancy. Denies abdominal pain, lips cracked and dry on assessment, alert and oriented.

## 2016-08-20 ENCOUNTER — Observation Stay (HOSPITAL_COMMUNITY)
Admission: EM | Admit: 2016-08-20 | Discharge: 2016-08-20 | Disposition: A | Discharge status: Home / Self Care | Payer: 59 | Attending: Obstetrics & Gynecology | Admitting: Obstetrics & Gynecology

## 2016-08-20 ENCOUNTER — Encounter (HOSPITAL_COMMUNITY): Payer: Self-pay | Admitting: Emergency Medicine

## 2016-08-20 ENCOUNTER — Observation Stay (HOSPITAL_COMMUNITY): Payer: 59 | Admitting: Anesthesiology

## 2016-08-20 ENCOUNTER — Encounter (HOSPITAL_COMMUNITY)
Admission: EM | Disposition: A | Discharge status: Home / Self Care | Payer: Self-pay | Source: Home / Self Care | Attending: Emergency Medicine

## 2016-08-20 DIAGNOSIS — O034 Incomplete spontaneous abortion without complication: Secondary | ICD-10-CM | POA: Diagnosis not present

## 2016-08-20 DIAGNOSIS — N939 Abnormal uterine and vaginal bleeding, unspecified: Secondary | ICD-10-CM

## 2016-08-20 DIAGNOSIS — O99331 Smoking (tobacco) complicating pregnancy, first trimester: Secondary | ICD-10-CM | POA: Diagnosis not present

## 2016-08-20 DIAGNOSIS — Z3A13 13 weeks gestation of pregnancy: Secondary | ICD-10-CM | POA: Diagnosis not present

## 2016-08-20 DIAGNOSIS — O039 Complete or unspecified spontaneous abortion without complication: Secondary | ICD-10-CM

## 2016-08-20 DIAGNOSIS — F17210 Nicotine dependence, cigarettes, uncomplicated: Secondary | ICD-10-CM | POA: Diagnosis not present

## 2016-08-20 DIAGNOSIS — O4691 Antepartum hemorrhage, unspecified, first trimester: Secondary | ICD-10-CM | POA: Diagnosis present

## 2016-08-20 HISTORY — PX: DILATION AND EVACUATION: SHX1459

## 2016-08-20 LAB — BASIC METABOLIC PANEL
Anion gap: 10 (ref 5–15)
BUN: 5 mg/dL — AB (ref 6–20)
CO2: 20 mmol/L — ABNORMAL LOW (ref 22–32)
CREATININE: 0.61 mg/dL (ref 0.44–1.00)
Calcium: 9.3 mg/dL (ref 8.9–10.3)
Chloride: 106 mmol/L (ref 101–111)
Glucose, Bld: 130 mg/dL — ABNORMAL HIGH (ref 65–99)
POTASSIUM: 3.8 mmol/L (ref 3.5–5.1)
SODIUM: 136 mmol/L (ref 135–145)

## 2016-08-20 LAB — CBC WITH DIFFERENTIAL/PLATELET
Basophils Absolute: 0 10*3/uL (ref 0.0–0.1)
Basophils Relative: 0 %
EOS ABS: 0.2 10*3/uL (ref 0.0–0.7)
EOS PCT: 3 %
HCT: 35.8 % — ABNORMAL LOW (ref 36.0–46.0)
Hemoglobin: 12 g/dL (ref 12.0–15.0)
LYMPHS ABS: 2 10*3/uL (ref 0.7–4.0)
Lymphocytes Relative: 22 %
MCH: 29.1 pg (ref 26.0–34.0)
MCHC: 33.5 g/dL (ref 30.0–36.0)
MCV: 86.7 fL (ref 78.0–100.0)
Monocytes Absolute: 0.5 10*3/uL (ref 0.1–1.0)
Monocytes Relative: 5 %
Neutro Abs: 6.4 10*3/uL (ref 1.7–7.7)
Neutrophils Relative %: 70 %
PLATELETS: 279 10*3/uL (ref 150–400)
RBC: 4.13 MIL/uL (ref 3.87–5.11)
RDW: 12.8 % (ref 11.5–15.5)
WBC: 9.2 10*3/uL (ref 4.0–10.5)

## 2016-08-20 LAB — CBC
HCT: 28.4 % — ABNORMAL LOW (ref 36.0–46.0)
HEMOGLOBIN: 10 g/dL — AB (ref 12.0–15.0)
MCH: 30.5 pg (ref 26.0–34.0)
MCHC: 35.2 g/dL (ref 30.0–36.0)
MCV: 86.6 fL (ref 78.0–100.0)
PLATELETS: 233 10*3/uL (ref 150–400)
RBC: 3.28 MIL/uL — AB (ref 3.87–5.11)
RDW: 13 % (ref 11.5–15.5)
WBC: 9.9 10*3/uL (ref 4.0–10.5)

## 2016-08-20 LAB — TYPE AND SCREEN
ABO/RH(D): O POS
Antibody Screen: NEGATIVE

## 2016-08-20 LAB — I-STAT BETA HCG BLOOD, ED (MC, WL, AP ONLY): I-stat hCG, quantitative: >2000 m[IU]/mL — ABNORMAL HIGH (ref <5)

## 2016-08-20 SURGERY — DILATION AND EVACUATION, UTERUS
Anesthesia: General | Site: Uterus

## 2016-08-20 MED ORDER — FAMOTIDINE IN NACL 20-0.9 MG/50ML-% IV SOLN
20.0000 mg | Freq: Once | INTRAVENOUS | Status: AC
  Administered 2016-08-20: 20 mg via INTRAVENOUS
  Filled 2016-08-20: qty 50

## 2016-08-20 MED ORDER — DEXAMETHASONE SODIUM PHOSPHATE 10 MG/ML IJ SOLN
INTRAMUSCULAR | Status: DC | PRN
  Administered 2016-08-20: 10 mg via INTRAVENOUS

## 2016-08-20 MED ORDER — DEXAMETHASONE SODIUM PHOSPHATE 10 MG/ML IJ SOLN
INTRAMUSCULAR | Status: AC
  Filled 2016-08-20: qty 1

## 2016-08-20 MED ORDER — SODIUM CHLORIDE 0.9 % IV SOLN
Freq: Once | INTRAVENOUS | Status: AC
  Administered 2016-08-20: 07:00:00 via INTRAVENOUS

## 2016-08-20 MED ORDER — LACTATED RINGERS IV SOLN: INTRAVENOUS | Status: DC

## 2016-08-20 MED ORDER — PROMETHAZINE HCL 12.5 MG PO TABS: 12.5000 mg | ORAL_TABLET | Freq: Four times a day (QID) | ORAL | 0 refills | Status: DC | PRN

## 2016-08-20 MED ORDER — PRENATAL MULTIVITAMIN CH
1.0000 | ORAL_TABLET | Freq: Every day | ORAL | Status: DC
  Filled 2016-08-20: qty 1

## 2016-08-20 MED ORDER — SCOPOLAMINE 1 MG/3DAYS TD PT72
MEDICATED_PATCH | TRANSDERMAL | Status: AC
  Filled 2016-08-20: qty 1

## 2016-08-20 MED ORDER — FENTANYL CITRATE (PF) 100 MCG/2ML IJ SOLN
INTRAMUSCULAR | Status: AC
  Filled 2016-08-20: qty 2

## 2016-08-20 MED ORDER — OXYTOCIN 10 UNIT/ML IJ SOLN
INTRAMUSCULAR | Status: DC | PRN
  Administered 2016-08-20: 20 [IU] via INTRAMUSCULAR

## 2016-08-20 MED ORDER — MIDAZOLAM HCL 2 MG/2ML IJ SOLN
INTRAMUSCULAR | Status: DC | PRN
  Administered 2016-08-20: 2 mg via INTRAVENOUS

## 2016-08-20 MED ORDER — OXYCODONE HCL 5 MG/5ML PO SOLN: 5.0000 mg | Freq: Once | ORAL | Status: DC | PRN

## 2016-08-20 MED ORDER — PROMETHAZINE HCL 25 MG/ML IJ SOLN: 6.2500 mg | INTRAMUSCULAR | Status: DC | PRN

## 2016-08-20 MED ORDER — MIDAZOLAM HCL 2 MG/2ML IJ SOLN
INTRAMUSCULAR | Status: AC
  Filled 2016-08-20: qty 2

## 2016-08-20 MED ORDER — LIDOCAINE HCL (CARDIAC) 20 MG/ML IV SOLN
INTRAVENOUS | Status: DC | PRN
  Administered 2016-08-20: 30 mg via INTRAVENOUS

## 2016-08-20 MED ORDER — SUCCINYLCHOLINE CHLORIDE 20 MG/ML IJ SOLN
INTRAMUSCULAR | Status: DC | PRN
  Administered 2016-08-20: 120 mg via INTRAVENOUS

## 2016-08-20 MED ORDER — MORPHINE SULFATE (PF) 4 MG/ML IV SOLN: 2.0000 mg | Freq: Once | INTRAVENOUS | Status: DC

## 2016-08-20 MED ORDER — MEPERIDINE HCL 25 MG/ML IJ SOLN: 6.2500 mg | INTRAMUSCULAR | Status: DC | PRN

## 2016-08-20 MED ORDER — ONDANSETRON HCL 4 MG/2ML IJ SOLN
4.0000 mg | Freq: Once | INTRAMUSCULAR | Status: AC
  Administered 2016-08-20: 4 mg via INTRAVENOUS
  Filled 2016-08-20: qty 2

## 2016-08-20 MED ORDER — OXYTOCIN 10 UNIT/ML IJ SOLN
INTRAMUSCULAR | Status: AC
  Filled 2016-08-20: qty 1

## 2016-08-20 MED ORDER — PROPOFOL 10 MG/ML IV BOLUS
INTRAVENOUS | Status: DC | PRN
  Administered 2016-08-20: 100 mg via INTRAVENOUS
  Administered 2016-08-20: 200 mg via INTRAVENOUS

## 2016-08-20 MED ORDER — SCOPOLAMINE 1 MG/3DAYS TD PT72
MEDICATED_PATCH | TRANSDERMAL | Status: DC | PRN
  Administered 2016-08-20: 1 via TRANSDERMAL

## 2016-08-20 MED ORDER — DOXYCYCLINE HYCLATE 100 MG PO CAPS: 100.0000 mg | ORAL_CAPSULE | Freq: Two times a day (BID) | ORAL | 0 refills | Status: AC

## 2016-08-20 MED ORDER — OXYCODONE HCL 5 MG PO TABS: 5.0000 mg | ORAL_TABLET | Freq: Once | ORAL | Status: DC | PRN

## 2016-08-20 MED ORDER — PROPOFOL 10 MG/ML IV BOLUS
INTRAVENOUS | Status: AC
  Filled 2016-08-20: qty 40

## 2016-08-20 MED ORDER — HYDROCODONE-ACETAMINOPHEN 5-325 MG PO TABS: 1.0000 | ORAL_TABLET | Freq: Four times a day (QID) | ORAL | 0 refills | Status: DC | PRN

## 2016-08-20 MED ORDER — METHYLERGONOVINE MALEATE 0.2 MG PO TABS: 0.2000 mg | ORAL_TABLET | Freq: Four times a day (QID) | ORAL | 0 refills | Status: AC

## 2016-08-20 MED ORDER — SODIUM CHLORIDE 0.9 % IV BOLUS (SEPSIS)
500.0000 mL | Freq: Once | INTRAVENOUS | Status: AC
  Administered 2016-08-20: 500 mL via INTRAVENOUS

## 2016-08-20 MED ORDER — PROPOFOL 10 MG/ML IV BOLUS
INTRAVENOUS | Status: AC
  Filled 2016-08-20: qty 20

## 2016-08-20 MED ORDER — FENTANYL CITRATE (PF) 100 MCG/2ML IJ SOLN
INTRAMUSCULAR | Status: DC | PRN
  Administered 2016-08-20: 100 ug via INTRAVENOUS

## 2016-08-20 MED ORDER — SOD CITRATE-CITRIC ACID 500-334 MG/5ML PO SOLN
30.0000 mL | Freq: Once | ORAL | Status: AC
  Administered 2016-08-20: 30 mL via ORAL
  Filled 2016-08-20: qty 15

## 2016-08-20 MED ORDER — LACTATED RINGERS IV SOLN
INTRAVENOUS | Status: DC
  Administered 2016-08-20 (×3): via INTRAVENOUS

## 2016-08-20 MED ORDER — ONDANSETRON HCL 4 MG/2ML IJ SOLN
INTRAMUSCULAR | Status: DC | PRN
  Administered 2016-08-20: 4 mg via INTRAVENOUS

## 2016-08-20 MED ORDER — LIDOCAINE HCL 1 % IJ SOLN
INTRAMUSCULAR | Status: DC | PRN
  Administered 2016-08-20: 19 mL

## 2016-08-20 MED ORDER — FENTANYL CITRATE (PF) 100 MCG/2ML IJ SOLN
25.0000 ug | INTRAMUSCULAR | Status: DC | PRN
  Administered 2016-08-20: 25 ug via INTRAVENOUS

## 2016-08-20 MED ORDER — IBUPROFEN 800 MG PO TABS: 800.0000 mg | ORAL_TABLET | Freq: Three times a day (TID) | ORAL | 3 refills | Status: DC | PRN

## 2016-08-20 MED ORDER — CEFAZOLIN SODIUM-DEXTROSE 2-4 GM/100ML-% IV SOLN
2.0000 g | INTRAVENOUS | Status: AC
  Administered 2016-08-20: 2 g via INTRAVENOUS
  Filled 2016-08-20: qty 100

## 2016-08-20 MED ORDER — ONDANSETRON HCL 4 MG/2ML IJ SOLN
INTRAMUSCULAR | Status: AC
  Filled 2016-08-20: qty 2

## 2016-08-20 SURGICAL SUPPLY — 20 items
CATH ROBINSON RED A/P 16FR (CATHETERS) ×3 IMPLANT
CLOTH BEACON ORANGE TIMEOUT ST (SAFETY) ×3 IMPLANT
DECANTER SPIKE VIAL GLASS SM (MISCELLANEOUS) ×3 IMPLANT
DILATOR CANAL MILEX (MISCELLANEOUS) IMPLANT
GLOVE BIO SURGEON STRL SZ 6.5 (GLOVE) ×2 IMPLANT
GLOVE BIO SURGEONS STRL SZ 6.5 (GLOVE) ×1
GLOVE BIOGEL PI IND STRL 7.0 (GLOVE) ×3 IMPLANT
GLOVE BIOGEL PI INDICATOR 7.0 (GLOVE) ×6
GOWN STRL REUS W/TWL LRG LVL3 (GOWN DISPOSABLE) ×6 IMPLANT
KIT BERKELEY 1ST TRIMESTER 3/8 (MISCELLANEOUS) ×3 IMPLANT
NS IRRIG 1000ML POUR BTL (IV SOLUTION) ×3 IMPLANT
PACK VAGINAL MINOR WOMEN LF (CUSTOM PROCEDURE TRAY) ×3 IMPLANT
PAD OB MATERNITY 4.3X12.25 (PERSONAL CARE ITEMS) ×3 IMPLANT
PAD PREP 24X48 CUFFED NSTRL (MISCELLANEOUS) ×3 IMPLANT
SET BERKELEY SUCTION TUBING (SUCTIONS) ×3 IMPLANT
TOWEL OR 17X24 6PK STRL BLUE (TOWEL DISPOSABLE) ×6 IMPLANT
VACURETTE 10 RIGID CVD (CANNULA) ×2 IMPLANT
VACURETTE 7MM CVD STRL WRAP (CANNULA) IMPLANT
VACURETTE 8 RIGID CVD (CANNULA) IMPLANT
VACURETTE 9 RIGID CVD (CANNULA) IMPLANT

## 2016-08-20 NOTE — Discharge Summary (Signed)
Physician Discharge Summary  Patient ID: Brandy Hansen MRN: [id number] DOB/AGE: 07/27/1985 31 y.o.  Admit date: 08/20/2016 Discharge date: 08/20/2016  Admission Diagnoses: incomplete ab, retained poc  Discharge Diagnoses:  Active Problems:   Placenta, retained   Discharged Condition: good  Hospital Course: Patient evaluated in MAU when she was transferred from [facility]. Patient with active bleeding and products of conception at the external os. She was urgently taken to OR where a dilation and evacuation was performed w/o difficulty. Patient remained hemodynamically stable throughout hospital course. She met discharge criteria in recovery room and was discharged home.   Consults: None  Significant Diagnostic Studies: labs: Type and screen O+   Treatments: IV hydration, antibiotics: Ancef and surgery: D&E  Discharge Exam: Blood pressure 108/90, pulse 94, temperature 98.3 F (36.8 C), resp. rate 16, height '5\' 7"'  (1.702 m), weight 77.1 kg (170 lb), last menstrual period 05/10/2016, SpO2 99 %. General appearance: alert, cooperative and no distress Pelvic: cervix closed uterus 8 weeks size no active bleeding post operatively  Disposition: 01-Home or Self Care  Discharge Instructions    Call MD for:    Complete by:  As directed    Abnormal foul smelling vaginal discharge or heavy vaginal bleeding   Call MD for:  difficulty breathing, headache or visual disturbances    Complete by:  As directed    Call MD for:  hives    Complete by:  As directed    Call MD for:  persistant dizziness or light-headedness    Complete by:  As directed    Call MD for:  persistant nausea and vomiting    Complete by:  As directed    Call MD for:  redness, tenderness, or signs of infection (pain, swelling, redness, odor or green/yellow discharge around incision site)    Complete by:  As directed    Call MD for:  severe uncontrolled pain    Complete by:  As directed    Call MD for:  temperature  >100.4    Complete by:  As directed    Diet - low sodium heart healthy    Complete by:  As directed    Discharge instructions    Complete by:  As directed    Call for your post operative visit with Dr. Alwyn Pea in 4 weeks, and if you have any questions or concerns.   Increase activity slowly    Complete by:  As directed    Sexual Activity Restrictions    Complete by:  As directed    No intercourse or anything in vagina for 4-6 weeks or until seen at post operative visit.     Allergies as of 08/20/2016      Reactions   Codeine Itching   Naproxen Nausea And Vomiting, Rash   Sulfa Antibiotics Rash      Medication List    STOP taking these medications   Doxylamine-Pyridoxine 10-10 MG Tbec     TAKE these medications   doxycycline 100 MG capsule Commonly known as:  VIBRAMYCIN Take 1 capsule (100 mg total) by mouth 2 (two) times daily.   HYDROcodone-acetaminophen 5-325 MG tablet Commonly known as:  NORCO/VICODIN Take 1-2 tablets by mouth every 6 (six) hours as needed for moderate pain.   ibuprofen 800 MG tablet Commonly known as:  ADVIL,MOTRIN Take 1 tablet (800 mg total) by mouth every 8 (eight) hours as needed.   methylergonovine 0.2 MG tablet Commonly known as:  METHERGINE Take 1 tablet (0.2 mg total)  by mouth every 6 (six) hours.   PRENATAL VITAMINS PO Take 1 tablet by mouth daily.   promethazine 12.5 MG tablet Commonly known as:  PHENERGAN Take 1 tablet (12.5 mg total) by mouth every 6 (six) hours as needed for nausea or vomiting.        SignedCaffie Damme 08/20/2016, 5:40 PM

## 2016-08-20 NOTE — Transfer of Care (Signed)
Immediate Anesthesia Transfer of Care Note  Patient: Brandy Hansen  Procedure(s) Performed: Procedure(s): DILATATION AND EVACUATION (N/A)  Patient Location: PACU  Anesthesia Type:General  Level of Consciousness: awake, alert , oriented and patient cooperative  Airway & Oxygen Therapy: Patient Spontanous Breathing and Patient connected to nasal cannula oxygen  Post-op Assessment: Report given to RN, Post -op Vital signs reviewed and stable and Patient moving all extremities X 4  Post vital signs: Reviewed and stable  Last Vitals:  Vitals:   08/20/16 0828 08/20/16 0852  BP: 123/80 115/71  Pulse: 96 92  Resp: 16 16  Temp:  36.4 C    Last Pain:  Vitals:   08/20/16 0854  TempSrc:   PainSc: 6       Patients Stated Pain Goal: 0 (08/20/16 0854)  Complications: No apparent anesthesia complications, discussed loose tooth with patient and informed she needs to seek dental care ASAP. Patient does not want [place] to remove tooth.  Explained risk of loose tooth, potential for it to fall out and end up in her airway.

## 2016-08-20 NOTE — ED Notes (Signed)
[facility] at bedside to transfer patient to [facility] hospital.

## 2016-08-20 NOTE — Anesthesia Procedure Notes (Signed)
Procedure Name: Intubation Date/Time: 08/20/2016 9:36 AM Performed by: Gilmer Mor R Pre-anesthesia Checklist: Patient identified, Emergency Drugs available, Suction available and Patient being monitored Patient Re-evaluated:Patient Re-evaluated prior to inductionOxygen Delivery Method: Circle system utilized Preoxygenation: Pre-oxygenation with 100% oxygen Intubation Type: IV induction, Cricoid Pressure applied and Rapid sequence Laryngoscope Size: Mac and 3 Grade View: Grade II Tube type: Oral Tube size: 7.0 mm Number of attempts: 1 Airway Equipment and Method: Stylet Placement Confirmation: ETT inserted through vocal cords under direct vision,  positive ETCO2 and breath sounds checked- equal and bilateral Secured at: 21 (right lip) cm Tube secured with: Tape Dental Injury: Teeth and Oropharynx as per pre-operative assessment and Dental damage  Comments: Patient with poor dentition. Front lower teeth loose with apparent gum disease. Teeth remain intact

## 2016-08-20 NOTE — ED Provider Notes (Signed)
[facility]-EMERGENCY DEPT Provider Note   CSN: [id number] Arrival date & time: 08/20/16  0556     History   Chief Complaint Chief Complaint  Patient presents with  . Vaginal Bleeding  . Miscarriage    HPI Brandy Hansen is a 31 y.o. female.  Patient presents with vaginal bleeding while pregnant. G3Ab1P1, with history of ectopic. She has had prenatal care and reports she was determined to be 13-weeks by her OB this week by ultrasound. She started having vaginal bleeding this morning around 3:00 and believes she passed the fetus. She states the bleeding is continuous. No anticoagulants or aspirin. She denies pain or cramping.    The history is provided by the patient. No language interpreter was used.    Past Medical History:  Diagnosis Date  . Anxiety   . Bipolar 1 disorder (HCC)   . Chronic kidney disease    ? problems as child, "stretched" fine since  . Depression   . HPV (human papilloma virus) anogenital infection   . Mental disorder   . Ovarian cyst   . PONV (postoperative nausea and vomiting)     Patient Active Problem List   Diagnosis Date Noted  . History of ectopic pregnancy 07/19/2011    Past Surgical History:  Procedure Laterality Date  . CHONDROPLASTY Right 10/27/2014   Procedure: CHONDROPLASTY;  Surgeon: Jodi Geralds, MD;  Location: [facility];  Service: Orthopedics;  Laterality: Right;  . KNEE ARTHROSCOPY Right 10/27/2014   Procedure: ARTHROSCOPY KNEE with chondroplasty of medial femeral condyle and femoral patella joint;  Surgeon: Jodi Geralds, MD;  Location: [facility];  Service: Orthopedics;  Laterality: Right;  . LAPAROSCOPY  06/24/2011   Procedure: LAPAROSCOPY OPERATIVE;  Surgeon: Lesly Dukes, MD;  Location: [facility];  Service: Gynecology;  Laterality: Right;  operative laparoscopy and right salpingectomy with removal of ectopic pregnancy  . WRIST SURGERY      OB History    Gravida Para Term Preterm AB Living   SAB TAB Ectopic Multiple Live Births       1           Home Medications    Prior to Admission medications   Medication Sig Start Date End Date Taking? Authorizing Provider  Doxylamine-Pyridoxine 10-10 MG TBEC Take 2 tablets by mouth at bedtime. Doxylamine 10 mg/pyridoxine 10 mg (Diclegis): Initial: Two tablets at bedtime on day 1 and 2; if symptoms persist, take 1 tablet in morning and 2 tablets at bedtime on day 3; if symptoms persist, may increase to 1 tablet in morning, 1 tablet mid-afternoon, and 2 tablets at bedtime on day 4 (maximum: doxylamine 40 mg/pyridoxine 40 mg (4 tablets) per day). 07/21/16   Georgiana Shore, PA-C  Prenatal Multivit-Min-Fe-FA (PRENATAL VITAMINS PO) Take 1 tablet by mouth daily.    Historical Provider, MD  promethazine (PHENERGAN) 25 MG tablet Take 25 mg by mouth every 4 (four) hours as needed for nausea or vomiting.    Historical Provider, MD    Family History Family History  Problem Relation Age of Onset  . Diabetes Mother   . Hypertension Father   . Anesthesia problems Neg Hx     Social History Social History  Substance Use Topics  . Smoking status: Current Every Day Smoker    Types: Cigarettes  . Smokeless tobacco: Never Used  . Alcohol use Yes     Comment: rarely  Allergies   Codeine; Naproxen; and Sulfa antibiotics   Review of Systems Review of Systems  Constitutional: Negative for fatigue and fever.  Respiratory: Negative for shortness of breath.   Cardiovascular: Negative for chest pain.  Gastrointestinal: Negative for abdominal pain and nausea.  Genitourinary: Positive for vaginal bleeding. Negative for pelvic pain and vaginal pain.  Musculoskeletal: Negative for back pain and myalgias.  Skin: Negative for pallor.  Neurological: Negative for weakness.     Physical Exam Updated Vital Signs BP 118/77   Pulse 99   Temp 98.2 F (36.8 C) (Oral)   Resp 18   Ht  (1.702 m)   Wt 77.1 kg   LMP 05/10/2016 Comment:  [redacted] weeks pregnant  SpO2 98%   BMI 26.63 kg/m   Physical Exam  Constitutional: She is oriented to person, place, and time. She appears well-developed and well-nourished.  Neck: Normal range of motion.  Pulmonary/Chest: Effort normal.  Genitourinary:  Genitourinary Comments: Patient actively bleeding heavily, sitting on bedside commode. Pelvic exam deferred to OB.  Neurological: She is alert and oriented to person, place, and time.  Skin: Skin is warm and dry.     ED Treatments / Results  Labs (all labs ordered are listed, but only abnormal results are displayed) Labs Reviewed  I-STAT BETA HCG BLOOD, ED ([facility], [facility], [facility] ONLY)    EKG  EKG Interpretation None       Radiology No results found.  Procedures Procedures (including critical care time)  Medications Ordered in ED Medications - No data to display   Initial Impression / Assessment and Plan / ED Course  I have reviewed the triage vital signs and the nursing notes.  Pertinent labs & imaging results that were available during my care of the patient were reviewed by me and considered in my medical decision making (see chart for details).     Patient presents known 13-weeks pregnant, bleeding heavily. She is holding what appears to be POC. Consult to OB placed.   Discussed with Dr. Claiborne Billings who accepts for transfer to [facility]. Patient and family updated. VS remain stable. Hgb 12.   Final Clinical Impressions(s) / ED Diagnoses   Final diagnoses:  None   1. SAB 2. Heavy vaginal bleeding  New Prescriptions New Prescriptions   No medications on file     Elpidio Anis, Cordelia Poche 08/20/16 0750    Zadie Rhine, MD 08/21/16 [phone]

## 2016-08-20 NOTE — Op Note (Signed)
Operative Note  Brandy Hansen female 31 y.o. 08/20/16   Indications: The patient with retained products of conception after missed ab at 13 weeks.      Surgeon: Essie Hart   Assistants: none  Anesthesia: General ETA and Local anesthesia 1% buffered lidocaine  ASA Class: 2  Procedure: DILATATION AND EVACUATION  Findings:  12 week size uterus to 8 size post procedure. Moderate amount of retained POC.  Good crie was achieved.  Estimated Blood Loss:  150 mL         Drains: straight cath prior to procedure- 30 mL urine         Total IV Fluids: 1300 mL LR  Blood Given: none          Specimens: POC         Implants: none        Complications:  None         Technique:   Patient was taken to OR 8 where she was placed in dorsal lithotomy position in [employer]. After adequate anesthesia was achieved, the patient was prepped and draped in the usual sterile fashion. A Time Out was done. An operative Graves speculum was placed in the vagina and the cervix stabilized with a single-tooth tenaculum.  A paracervical block was done with 1% lidocaine plain. The cervix was dilated with Hegar dilators and the 10 mm curette was used to remove contents of the uterus.  Alternating sharp curettage with a curette and suction curettage was performed until all contents were removed and good crie was achieved.  All instruments were removed from the vagina.  Counts were correct after the procedure.  The patient tolerated the procedure well.    Disposition: PACU - hemodynamically stable.         Condition: stable  [name] STACIA

## 2016-08-20 NOTE — Discharge Instructions (Signed)
DISCHARGE INSTRUCTIONS: D&E The following instructions have been prepared to help you care for yourself upon your return home.   Personal hygiene:  Use sanitary pads for vaginal drainage, not tampons.  Shower the day after your procedure.  NO tub baths, pools or Jacuzzis for 2-3 weeks.  Wipe front to back after using the bathroom.  Activity and limitations:  Do NOT drive or operate any equipment for 24 hours. The effects of anesthesia are still present and drowsiness may result.  Do NOT rest in bed all day.  Walking is encouraged.  Walk up and down stairs slowly.  You may resume your normal activity in one to two days or as indicated by your physician.  Sexual activity: NO intercourse for 4 weeks after the procedure, or as indicated by your physician.  Diet: Eat a light meal as desired this evening. You may resume your usual diet tomorrow.  Return to work: You may resume your work activities in one to two days or as indicated by your doctor.  What to expect after your surgery: Expect to have vaginal bleeding/discharge for 2-3 days and spotting for up to 10 days. It is not unusual to have soreness for up to 1-2 weeks. You may have a slight burning sensation when you urinate for the first day. Mild cramps may continue for a couple of days. You may have a regular period in 2-6 weeks.  Call your doctor for any of the following:  Excessive vaginal bleeding, saturating and changing one pad every hour.  Inability to urinate 6 hours after discharge from hospital.  Pain not relieved by pain medication.  Fever of 100.4 F or greater.  Unusual vaginal discharge or odor.  Call Dr. Madalyn Rob office to make a follow-up appointment for 4 weeks  Patients signature: _______________________________  Nurses signature _________________________________  Support person's signature__________________________

## 2016-08-20 NOTE — ED Triage Notes (Addendum)
Pt c/o vaginal bleeding onset about 1 hour PTA. Pt is [redacted] weeks pregnant. Pt unable to get off bedside commode due to continuous vaginal bleeding. This is pt 3rd pregnancy.

## 2016-08-20 NOTE — Anesthesia Postprocedure Evaluation (Addendum)
Anesthesia Post Note  Patient: Brandy Hansen  Procedure(s) Performed: Procedure(s) (LRB): DILATATION AND EVACUATION (N/A)  Patient location during evaluation: PACU Anesthesia Type: General Level of consciousness: awake and alert Pain management: pain level controlled Vital Signs Assessment: post-procedure vital signs reviewed and stable Respiratory status: spontaneous breathing, nonlabored ventilation, respiratory function stable and patient connected to nasal cannula oxygen Cardiovascular status: blood pressure returned to baseline and stable Postop Assessment: no signs of nausea or vomiting Anesthetic complications: no Comments: Loose tooth present. Pt aware and states it has been an issue in the past. Does not want to pull it at this time.         Last Vitals:  Vitals:   08/20/16 1030 08/20/16 1045  BP: 110/71 112/70  Pulse: 85 87  Resp: 16 15  Temp: 36.6 C     Last Pain:  Vitals:   08/20/16 1100  TempSrc:   PainSc: 1    Pain Goal: Patients Stated Pain Goal: 0 (08/20/16 0854)               Shelton Silvas

## 2016-08-20 NOTE — Anesthesia Preprocedure Evaluation (Addendum)
Anesthesia Evaluation  Patient identified by MRN, date of birth, ID band Patient awake    Reviewed: Allergy & Precautions, NPO status , Patient's Chart, lab work & pertinent test results  History of Anesthesia Complications (+) PONV and history of anesthetic complications  Airway Mallampati: III  TM Distance: >3 FB Neck ROM: Full    Dental  (+) Dental Advisory Given, Poor Dentition, Loose,    Pulmonary Current Smoker,    breath sounds clear to auscultation       Cardiovascular negative cardio ROS   Rhythm:Regular Rate:Normal     Neuro/Psych PSYCHIATRIC DISORDERS Anxiety Depression Bipolar Disorder negative neurological ROS     GI/Hepatic negative GI ROS, Neg liver ROS,   Endo/Other  negative endocrine ROS  Renal/GU CRFRenal disease  negative genitourinary   Musculoskeletal negative musculoskeletal ROS (+)   Abdominal   Peds negative pediatric ROS (+)  Hematology negative hematology ROS (+)   Anesthesia Other Findings   Reproductive/Obstetrics negative OB ROS                           Anesthesia Physical Anesthesia Plan  ASA: II and emergent  Anesthesia Plan: General   Post-op Pain Management:    Induction: Intravenous, Rapid sequence and Cricoid pressure planned  Airway Management Planned: Oral ETT  Additional Equipment:   Intra-op Plan:   Post-operative Plan: Extubation in OR  Informed Consent: I have reviewed the patients History and Physical, chart, labs and discussed the procedure including the risks, benefits and alternatives for the proposed anesthesia with the patient or authorized representative who has indicated his/her understanding and acceptance.   Dental advisory given  Plan Discussed with: CRNA  Anesthesia Plan Comments: (Pt complaint of Nausea)       Anesthesia Quick Evaluation

## 2016-08-20 NOTE — MAU Note (Signed)
1914 -Patient arrived vis [facility] as transfer for further evaluation for possible D/E today. 20G iv noted upon arrival in [facility]. NS infusing.   0830--Dr. Mora Appl bedside. Pelvic exam; moderate bleeding noted by RN.             Dr. Mora Appl performed bedside ultrasound.  0851--consent obtained for D/E. Prep for OR.  0903--Dr. Hart [place], Anesthesiologist bedside.

## 2016-08-20 NOTE — Progress Notes (Signed)
Phone call to Dr Mora Appl re: pitocin gtt from OR.  OK to d/c when patient ready for d/c home.

## 2016-08-20 NOTE — H&P (Signed)
Brandy Hansen is an 31 y.o. female G3P1 transferred from [facility] ER for missed ab at 13 weeks 2 days She notes she started leaking fluid and bleeding profusely approximately 5:30 am this morning. Right before she had some back pain but thought it was gas.  She passed the fetus and a large amount of tissue in the ER.  She has been bleeding and passing tissue since.  She denies chest pain, no shortness of breath, no feeling light headed or dizzy.  Pertinent Gynecological History: H/o Right salpingectomy for ectopic pregnancy   Patient's last menstrual period was 05/10/2016.    Past Medical History:  Diagnosis Date  . Anxiety   . Bipolar 1 disorder (HCC)   . Chronic kidney disease    ? problems as child, "stretched" fine since  . Depression   . HPV (human papilloma virus) anogenital infection   . Mental disorder   . Ovarian cyst   . PONV (postoperative nausea and vomiting)     Past Surgical History:  Procedure Laterality Date  . CHONDROPLASTY Right 10/27/2014   Procedure: CHONDROPLASTY;  Surgeon: Jodi Geralds, MD;  Location: [facility];  Service: Orthopedics;  Laterality: Right;  . KNEE ARTHROSCOPY Right 10/27/2014   Procedure: ARTHROSCOPY KNEE with chondroplasty of medial femeral condyle and femoral patella joint;  Surgeon: Jodi Geralds, MD;  Location: [facility];  Service: Orthopedics;  Laterality: Right;  . LAPAROSCOPY  06/24/2011   Procedure: LAPAROSCOPY OPERATIVE;  Surgeon: Lesly Dukes, MD;  Location: [facility];  Service: Gynecology;  Laterality: Right;  operative laparoscopy and right salpingectomy with removal of ectopic pregnancy  . WRIST SURGERY      Family History  Problem Relation Age of Onset  . Diabetes Mother   . Hypertension Father   . Anesthesia problems Neg Hx     Social History:  reports that she has been smoking Cigarettes.  She has never used smokeless tobacco. She reports that she drinks alcohol. She reports that she does not  use drugs.  Allergies:  Allergies  Allergen Reactions  . Codeine Itching  . Naproxen Nausea And Vomiting and Rash  . Sulfa Antibiotics Rash    Prescriptions Prior to Admission  Medication Sig Dispense Refill Last Dose  . Doxylamine-Pyridoxine 10-10 MG TBEC Take 2 tablets by mouth at bedtime. Doxylamine 10 mg/pyridoxine 10 mg (Diclegis): Initial: Two tablets at bedtime on day 1 and 2; if symptoms persist, take 1 tablet in morning and 2 tablets at bedtime on day 3; if symptoms persist, may increase to 1 tablet in morning, 1 tablet mid-afternoon, and 2 tablets at bedtime on day 4 (maximum: doxylamine 40 mg/pyridoxine 40 mg (4 tablets) per day). 60 tablet 0   . Prenatal Multivit-Min-Fe-FA (PRENATAL VITAMINS PO) Take 1 tablet by mouth daily.   07/19/2016  . promethazine (PHENERGAN) 25 MG tablet Take 25 mg by mouth every 4 (four) hours as needed for nausea or vomiting.   couple days ago    ROS  As per HPI  Blood pressure 115/71, pulse 92, temperature 97.5 F (36.4 C), temperature source Oral, resp. rate 16, height  (1.702 m), weight 77.1 kg (170 lb), last menstrual period 05/10/2016, SpO2 100 %. Physical Exam  Genitourinary:  Genitourinary Comments: Cervical Os is open, bright red bleeding.  Large amount of placental tissue evacuated from the vagina and external cervical os.  Bedside US performed in MAU: Still a large amount of membranes and clot in the uterus.  Results for orders placed or performed during the hospital encounter of 08/20/16 (from the past 24 hour(s))  I-Stat beta hCG blood, ED     Status: Abnormal   Collection Time: 08/20/16  6:22 AM  Result Value Ref Range   I-stat hCG, quantitative >2,000.0 (H) <5 mIU/mL   Comment 3          CBC with Differential     Status: Abnormal   Collection Time: 08/20/16  6:41 AM  Result Value Ref Range   WBC 9.2 4.0 - 10.5 K/uL   RBC 4.13 3.87 - 5.11 MIL/uL   Hemoglobin 12.0 12.0 - 15.0 g/dL   HCT 91.4 (L) 78.2 - 95.6 %   MCV 86.7  78.0 - 100.0 fL   MCH 29.1 26.0 - 34.0 pg   MCHC 33.5 30.0 - 36.0 g/dL   RDW 21.3 08.6 - 57.8 %   Platelets 279 150 - 400 K/uL   Neutrophils Relative % 70 %   Neutro Abs 6.4 1.7 - 7.7 K/uL   Lymphocytes Relative 22 %   Lymphs Abs 2.0 0.7 - 4.0 K/uL   Monocytes Relative 5 %   Monocytes Absolute 0.5 0.1 - 1.0 K/uL   Eosinophils Relative 3 %   Eosinophils Absolute 0.2 0.0 - 0.7 K/uL   Basophils Relative 0 %   Basophils Absolute 0.0 0.0 - 0.1 K/uL  Basic metabolic panel     Status: Abnormal   Collection Time: 08/20/16  6:41 AM  Result Value Ref Range   Sodium 136 135 - 145 mmol/L   Potassium 3.8 3.5 - 5.1 mmol/L   Chloride 106 101 - 111 mmol/L   CO2 20 (L) 22 - 32 mmol/L   Glucose, Bld 130 (H) 65 - 99 mg/dL   BUN 5 (L) 6 - 20 mg/dL   Creatinine, Ser 4.69 0.44 - 1.00 mg/dL   Calcium 9.3 8.9 - 62.9 mg/dL   GFR calc non Af Amer >60 >60 mL/min   GFR calc Af Amer >60 >60 mL/min   Anion gap 10 5 - 15    No results found.  Assessment/Plan: 31 yo s/p with incomplete Ab On call to OR for D&E Per Office labs: Blood type is O + Will Type and Cross match 2 units here  Ancef 2GM IV x 1 dose then Doxycycline po upon discharge Discussed risks of procedure with patient she voiced understanding and agrees to proceed. Consent Signed, witness and placed into chart  Joslyne Marshburn STACIA 08/20/2016, 9:06 AM

## 2016-08-21 ENCOUNTER — Encounter (HOSPITAL_COMMUNITY): Payer: Self-pay | Admitting: Obstetrics & Gynecology

## 2016-09-21 NOTE — Addendum Note (Signed)
Addendum  created 09/21/16 1220 by Yahia Bottger D, MD   Sign clinical note    

## 2018-01-10 DIAGNOSIS — O99019 Anemia complicating pregnancy, unspecified trimester: Secondary | ICD-10-CM | POA: Insufficient documentation

## 2018-04-23 NOTE — L&D Delivery Note (Signed)
Delivery Note Pt called to report increased vaginal pressure. Noted to be complete. She pushed 3-5 times and at 10:40 PM a viable female was delivered via Vaginal, Spontaneous (Presentation:LOA ;  ).  APGAR: 9, 9; weight pending .   Placenta status: delivered intact, duncan , .  Cord:3vc  with the following complications:none .  Cord pH: n/a  Anesthesia:  Epidural Episiotomy: None Lacerations: Vaginal Suture Repair: vicryl 4-0 Est. Blood Loss (mL): 271  Mom to postpartum.  Baby to Couplet care / Skin to Skin.  Brandy Hansen 07/24/2018, 10:58 PM

## 2018-06-19 ENCOUNTER — Telehealth: Payer: Self-pay | Admitting: Hematology

## 2018-06-19 ENCOUNTER — Telehealth: Payer: Self-pay | Admitting: Hematology and Oncology

## 2018-06-19 NOTE — Telephone Encounter (Signed)
Received a new he referral from Dr. Mindi Slicker for anemia in pregancy. I offered to schedule the pt w/Dr. Candise Che on 3/6 at 12pm but she was unable to do so. She preferred to be scheduled on her days off. She's been scheduled to see Dr. Candise Che on 3/11 at 11am. Aware to arrive 30 minutes early.

## 2018-06-19 NOTE — Telephone Encounter (Signed)
Pt cld to reschedule appt to 2/29 at 940  To see Dr. Pamelia Hoit

## 2018-07-01 NOTE — Progress Notes (Signed)
HEMATOLOGY/ONCOLOGY CONSULTATION NOTE  Date of Service: 07/02/2018  Patient Care Team: Arlan OrganManning, James S, MD as PCP - General (Family Medicine)  CHIEF COMPLAINTS/PURPOSE OF CONSULTATION:  Anemia in Pregnancy  HISTORY OF PRESENTING ILLNESS:   Brandy Hansen is a wonderful 33 y.o. female who has been referred to us by [facility] Pryor [facility]cho[facility]ecilia [facility] for evaluation and management of anemia in pregnancy. She is accompanied today by her mother. The pt reports that she is doing well overall. She is at [redacted] weeks gestation, and a pre-admit date of 08/05/18.  The pt reports that this is her fourth pregnancy, her only child is 418.35 years old, second pregnancy was ectopic and lost at [redacted] weeks gestation in 2013, third lost at 3713 weeks gestation in 2018. The pt notes that she had iron deficiency in the past, endorsing "very bad" periods which are heavy and last 7 days, 6 of which are heavy. She has not required iron or blood transfusions in the past. She denies taking iron supplements prior to this pregnancy. She is now taking 325mg  Ferrous sulfate every day, and has been for at least 4 weeks. She has recently begun Labetalol, and denies previous gestational high blood pressure. She also takes a prenatal vitamin. She had placental previa which has resolved on its own during this pregnancy. Denies previous cesarean section.  The pt notes that she is "exhausted," and attributes this to work and pregnancy. She denies hemorrhoids, blood in the stools, or vaginal bleeding. She endorses frequent ice cravings. She denies previous vitamin deficiencies. She notes that she has had some light headedness in the last couple weeks, denies dizziness.  Denies excessive bleeding with wrist and knee surgeries in the past.   Most recent lab results (06/13/18) of CBC w/diff is as follows: all values are WNL except for RBC at 3.69, HGB at 8.6, HCT at 27.5%, MCV at 75, MCH at 23.3, MCHC at 31.3, ANC at 7.8k.  On review  of systems, pt reports appropriate weight gain, feeling tired, occasional light headedness, eating well, and denies dizziness, vaginal bleeding, blood in the stools, hemorrhoids, abdominal pains, leg swelling, and any other symptoms.   On PMHx the pt reports wrist surgery, knee surgery, iron deficiency.   MEDICAL HISTORY:  Past Medical History:  Diagnosis Date  . Anxiety   . Bipolar 1 disorder (HCC)   . Chronic kidney disease    ? problems as child, "stretched" fine since  . Depression   . HPV (human papilloma virus) anogenital infection   . Mental disorder   . Ovarian cyst   . PONV (postoperative nausea and vomiting)     SURGICAL HISTORY: Past Surgical History:  Procedure Laterality Date  . CHONDROPLASTY Right 10/27/2014   Procedure: CHONDROPLASTY;  Surgeon: Jodi GeraldsJohn Graves, MD;  Location: [facility];  Service: Orthopedics;  Laterality: Right;  . DILATION AND EVACUATION N/A 08/20/2016   Procedure: DILATATION AND EVACUATION;  Surgeon: Essie HartWalda Pinn, MD;  Location: [facility];  Service: Gynecology;  Laterality: N/A;  . KNEE ARTHROSCOPY Right 10/27/2014   Procedure: ARTHROSCOPY KNEE with chondroplasty of medial femeral condyle and femoral patella joint;  Surgeon: Jodi GeraldsJohn Graves, MD;  Location: [facility];  Service: Orthopedics;  Laterality: Right;  . LAPAROSCOPY  06/24/2011   Procedure: LAPAROSCOPY OPERATIVE;  Surgeon: Lesly DukesKelly H. Leggett, MD;  Location: [facility];  Service: Gynecology;  Laterality: Right;  operative laparoscopy and right salpingectomy with removal of ectopic pregnancy  . WRIST SURGERY  SOCIAL HISTORY: Social History   Socioeconomic History  . Marital status: Married    Spouse name: Not on file  . Number of children: Not on file  . Years of education: Not on file  . Highest education level: Not on file  Occupational History  . Not on file  Social Needs  . Financial resource strain: Not on file  . Food insecurity:    Worry: Not on file     Inability: Not on file  . Transportation needs:    Medical: Not on file    Non-medical: Not on file  Tobacco Use  . Smoking status: Current Every Day Smoker    Types: Cigarettes  . Smokeless tobacco: Never Used  Substance and Sexual Activity  . Alcohol use: Yes    Comment: rarely  . Drug use: No  . Sexual activity: Yes  Lifestyle  . Physical activity:    Days per week: Not on file    Minutes per session: Not on file  . Stress: Not on file  Relationships  . Social connections:    Talks on phone: Not on file    Gets together: Not on file    Attends religious service: Not on file    Active member of club or organization: Not on file    Attends meetings of clubs or organizations: Not on file    Relationship status: Not on file  . Intimate partner violence:    Fear of current or ex partner: Not on file    Emotionally abused: Not on file    Physically abused: Not on file    Forced sexual activity: Not on file  Other Topics Concern  . Not on file  Social History Narrative  . Not on file    FAMILY HISTORY: Family History  Problem Relation Age of Onset  . Diabetes Mother   . Hypertension Father   . Anesthesia problems Neg Hx     ALLERGIES:  is allergic to codeine; naproxen; and sulfa antibiotics.  MEDICATIONS:  Current Outpatient Medications  Medication Sig Dispense Refill  . HYDROcodone-acetaminophen (NORCO/VICODIN) 5-325 MG tablet Take 1-2 tablets by mouth every 6 (six) hours as needed for moderate pain. 30 tablet 0  . ibuprofen (ADVIL,MOTRIN) 800 MG tablet Take 1 tablet (800 mg total) by mouth every 8 (eight) hours as needed. 30 tablet 3  . Prenatal Multivit-Min-Fe-FA (PRENATAL VITAMINS PO) Take 1 tablet by mouth daily.    . promethazine (PHENERGAN) 12.5 MG tablet Take 1 tablet (12.5 mg total) by mouth every 6 (six) hours as needed for nausea or vomiting. 30 tablet 0   No current facility-administered medications for this visit.     REVIEW OF SYSTEMS:    10 Point  review of Systems was done is negative except as noted above.  PHYSICAL EXAMINATION:  . Vitals:   07/02/18 1124  BP: (!) 143/82  Pulse: 91  Resp: 18  Temp: 97.8 F (36.6 C)  SpO2: 100%   Filed Weights   07/02/18 1124  Weight: 172 lb 14.4 oz (78.4 kg)   .Body mass index is 27.08 kg/m.  GENERAL:alert, in no acute distress and comfortable SKIN: no acute rashes, no significant lesions EYES: conjunctiva are pink and non-injected, sclera anicteric OROPHARYNX: MMM, no exudates, no oropharyngeal erythema or ulceration NECK: supple, no JVD LYMPH:  no palpable lymphadenopathy in the cervical, axillary or inguinal regions LUNGS: clear to auscultation b/l with normal respiratory effort HEART: regular rate & rhythm ABDOMEN:  normoactive bowel sounds ,  non tender, not distended. Extremity: no pedal edema PSYCH: alert & oriented x 3 with fluent speech NEURO: no focal motor/sensory deficits  LABORATORY DATA:  I have reviewed the data as listed  . CBC Latest Ref Rng & Units 07/02/2018 08/20/2016 08/20/2016  WBC 4.0 - 10.5 K/uL 8.6 9.9 9.2  Hemoglobin 12.0 - 15.0 g/dL 5.7(D) 10.0(L) 12.0  Hematocrit 36.0 - 46.0 % 28.2(L) 28.4(L) 35.8(L)  Platelets 150 - 400 K/uL 283 233 279    . CMP Latest Ref Rng & Units 07/02/2018 08/20/2016 07/21/2016  Glucose 70 - 99 mg/dL 75 051(G) 70  BUN 6 - 20 mg/dL 6 5(L) 10  Creatinine 3.35 - 1.00 mg/dL 8.25 1.89 8.42  Sodium 135 - 145 mmol/L 136 136 135  Potassium 3.5 - 5.1 mmol/L 3.9 3.8 3.5  Chloride 98 - 111 mmol/L 106 106 106  CO2 22 - 32 mmol/L 20(L) 20(L) 20(L)  Calcium 8.9 - 10.3 mg/dL 1.0(Z) 9.3 1.2(O)  Total Protein 6.5 - 8.1 g/dL 6.8 - -  Total Bilirubin 0.3 - 1.2 mg/dL 0.5 - -  Alkaline Phos 38 - 126 U/L 123 - -  AST 15 - 41 U/L 21 - -  ALT 0 - 44 U/L 11 - -   . Lab Results  Component Value Date   IRON 28 (L) 07/02/2018   TIBC 665 (H) 07/02/2018   IRONPCTSAT 4 (L) 07/02/2018   (Iron and TIBC)  Lab Results  Component Value Date    FERRITIN <4 (L) 07/02/2018     06/13/18 Outside Labs:    RADIOGRAPHIC STUDIES: I have personally reviewed the radiological images as listed and agreed with the findings in the report. No results found.  ASSESSMENT & PLAN:  33 y.o. female with  1. Anemia in pregnancy 2. Severe iron deficiency related to pregnancy PLAN -Discussed patient's most recent labs from 06/13/18, HGB at 8.6, MCV at 75, ANC at 7.8k -05/13/18 HGB at 8.7, MCV at 78 -Discussed that at this point of pregnancy, [redacted] weeks gestation, IV Iron will not make much of a difference by the time she delivers and would require fetal monitoring, and only limited doses of certain IV iron preparations are category B in pregnancy -Therefore, recommend optimizing PO iron as much as possible and recommend PO 150mg  Iron Polysaccharide BID with orange juice -No current indication for a blood transfusion, however if the patient's HGB <8 or patient is symptomatic this would be necessary with her Obgyn doctor with maternofetal monitoring. -Recommend continuing PO iron through breast feeding, if pt continues to be very iron deficient after delivery, will set pt up for IV Iron -Discussed that if the pt develops light headedness and dizziness she should call her OBGYN, as a blood transfusion would require fetal monitoring -Will order labs today -Will see the pt back in 8 weeks  #2 vitamin B12 deficiency likely related to increased amount of pregnancy. Patient was recommended to start taking vitamin B12 1000 micrograms p.o. daily over-the-counter. We will recheck levels on follow-up  Labs today RTC with Dr Candise Che with labs in 8 weeks   All of the patients questions were answered with apparent satisfaction. The patient knows to call the clinic with any problems, questions or concerns.  The total time spent in the appt was 30 minutes and more than 50% was on counseling and direct patient cares.    Wyvonnia Lora MD MS AAHIVMS [facility]  [facility] Hematology/Oncology Physician [facility]  (Office):  [phone] (Work cell):  [phone] (Fax):           [phone]  07/02/2018 11:55 AM  I, Marcelline Mates, am acting as a scribe for Dr. Wyvonnia Lora.   .I have reviewed the above documentation for accuracy and completeness, and I agree with the above. Johney Maine MD

## 2018-07-02 ENCOUNTER — Inpatient Hospital Stay: Payer: 59 | Attending: Hematology | Admitting: Hematology

## 2018-07-02 ENCOUNTER — Other Ambulatory Visit: Payer: Self-pay

## 2018-07-02 ENCOUNTER — Inpatient Hospital Stay: Payer: 59

## 2018-07-02 ENCOUNTER — Telehealth: Payer: Self-pay | Admitting: Hematology

## 2018-07-02 VITALS — BP 143/82 | HR 91 | Temp 97.8°F | Resp 18 | Ht 67.0 in | Wt 172.9 lb

## 2018-07-02 DIAGNOSIS — D509 Iron deficiency anemia, unspecified: Secondary | ICD-10-CM

## 2018-07-02 DIAGNOSIS — Z3A35 35 weeks gestation of pregnancy: Secondary | ICD-10-CM | POA: Insufficient documentation

## 2018-07-02 DIAGNOSIS — O99013 Anemia complicating pregnancy, third trimester: Secondary | ICD-10-CM | POA: Insufficient documentation

## 2018-07-02 DIAGNOSIS — E538 Deficiency of other specified B group vitamins: Secondary | ICD-10-CM

## 2018-07-02 LAB — CMP (CANCER CENTER ONLY)
ALT: 11 U/L (ref 0–44)
AST: 21 U/L (ref 15–41)
Albumin: 2.9 g/dL — ABNORMAL LOW (ref 3.5–5.0)
Alkaline Phosphatase: 123 U/L (ref 38–126)
Anion gap: 10 (ref 5–15)
BILIRUBIN TOTAL: 0.5 mg/dL (ref 0.3–1.2)
BUN: 6 mg/dL (ref 6–20)
CALCIUM: 8.7 mg/dL — AB (ref 8.9–10.3)
CO2: 20 mmol/L — ABNORMAL LOW (ref 22–32)
CREATININE: 0.61 mg/dL (ref 0.44–1.00)
Chloride: 106 mmol/L (ref 98–111)
Glucose, Bld: 75 mg/dL (ref 70–99)
Potassium: 3.9 mmol/L (ref 3.5–5.1)
Sodium: 136 mmol/L (ref 135–145)
TOTAL PROTEIN: 6.8 g/dL (ref 6.5–8.1)

## 2018-07-02 LAB — VITAMIN B12: VITAMIN B 12: 193 pg/mL (ref 180–914)

## 2018-07-02 LAB — CBC WITH DIFFERENTIAL/PLATELET
Abs Immature Granulocytes: 0.04 10*3/uL (ref 0.00–0.07)
BASOS ABS: 0 10*3/uL (ref 0.0–0.1)
BASOS PCT: 0 %
EOS ABS: 0.1 10*3/uL (ref 0.0–0.5)
EOS PCT: 1 %
HCT: 28.2 % — ABNORMAL LOW (ref 36.0–46.0)
HEMOGLOBIN: 8.4 g/dL — AB (ref 12.0–15.0)
Immature Granulocytes: 1 %
Lymphocytes Relative: 19 %
Lymphs Abs: 1.6 10*3/uL (ref 0.7–4.0)
MCH: 22.2 pg — AB (ref 26.0–34.0)
MCHC: 29.8 g/dL — AB (ref 30.0–36.0)
MCV: 74.6 fL — ABNORMAL LOW (ref 80.0–100.0)
MONO ABS: 0.6 10*3/uL (ref 0.1–1.0)
Monocytes Relative: 7 %
NRBC: 0 % (ref 0.0–0.2)
Neutro Abs: 6.3 10*3/uL (ref 1.7–7.7)
Neutrophils Relative %: 72 %
Platelets: 283 10*3/uL (ref 150–400)
RBC: 3.78 MIL/uL — AB (ref 3.87–5.11)
RDW: 14.5 % (ref 11.5–15.5)
WBC: 8.6 10*3/uL (ref 4.0–10.5)

## 2018-07-02 LAB — IRON AND TIBC
IRON: 28 ug/dL — AB (ref 41–142)
Saturation Ratios: 4 % — ABNORMAL LOW (ref 21–57)
TIBC: 665 ug/dL — AB (ref 236–444)
UIBC: 637 ug/dL — ABNORMAL HIGH (ref 120–384)

## 2018-07-02 LAB — FERRITIN

## 2018-07-02 MED ORDER — POLYSACCHARIDE IRON COMPLEX 150 MG PO CAPS: 150.0000 mg | ORAL_CAPSULE | Freq: Two times a day (BID) | ORAL | 4 refills | Status: DC

## 2018-07-02 NOTE — Telephone Encounter (Signed)
Scheduled appt per 3/11 los. ° °Printed calendar and avs. °

## 2018-07-08 LAB — OB RESULTS CONSOLE GBS: GBS: NEGATIVE

## 2018-07-11 ENCOUNTER — Telehealth: Payer: Self-pay | Admitting: *Deleted

## 2018-07-11 NOTE — Telephone Encounter (Signed)
Attempted to contact patient regarding test results per Dr. Clyda Greener directions:left voice mail to inform patient her iron levels are significantly low and reminded to take Iron polysaccharide 150mg  po BID as recommended. Also her B12 levels are low at 193 and Dr. Candise Che would recommend OTC B12 1000 mcg po daily. Will follow up at appointment in May. Encouraged to contact office [phone] for questions or concerns.

## 2018-07-16 ENCOUNTER — Other Ambulatory Visit: Payer: Self-pay | Admitting: Obstetrics and Gynecology

## 2018-07-16 ENCOUNTER — Other Ambulatory Visit (HOSPITAL_COMMUNITY): Payer: Self-pay | Admitting: *Deleted

## 2018-07-16 MED ORDER — SODIUM CHLORIDE 0.9 % IV SOLN: 510.0000 mg | Freq: Once | INTRAVENOUS | 0 refills | Status: AC

## 2018-07-16 NOTE — Progress Notes (Unsigned)
Pt with Hg 8.6; HCT 27.5%  Rec fereheme iv infusion

## 2018-07-22 ENCOUNTER — Ambulatory Visit (HOSPITAL_COMMUNITY)
Admission: RE | Admit: 2018-07-22 | Discharge: 2018-07-22 | Disposition: A | Discharge status: Home / Self Care | Payer: 59 | Source: Ambulatory Visit | Attending: Obstetrics and Gynecology | Admitting: Obstetrics and Gynecology

## 2018-07-22 ENCOUNTER — Other Ambulatory Visit: Payer: Self-pay

## 2018-07-22 DIAGNOSIS — O99013 Anemia complicating pregnancy, third trimester: Secondary | ICD-10-CM

## 2018-07-22 MED ORDER — SODIUM CHLORIDE 0.9 % IV SOLN
510.0000 mg | INTRAVENOUS | Status: DC
  Administered 2018-07-22: 11:00:00 510 mg via INTRAVENOUS
  Filled 2018-07-22: qty 510

## 2018-07-24 ENCOUNTER — Other Ambulatory Visit: Payer: Self-pay

## 2018-07-24 ENCOUNTER — Inpatient Hospital Stay (HOSPITAL_COMMUNITY)
Admission: AD | Admit: 2018-07-24 | Discharge: 2018-07-26 | DRG: 807 | Disposition: A | Discharge status: Home / Self Care | Payer: 59 | Attending: Obstetrics and Gynecology | Admitting: Obstetrics and Gynecology

## 2018-07-24 ENCOUNTER — Encounter (HOSPITAL_COMMUNITY): Payer: Self-pay

## 2018-07-24 ENCOUNTER — Inpatient Hospital Stay (HOSPITAL_COMMUNITY): Payer: 59 | Admitting: Anesthesiology

## 2018-07-24 DIAGNOSIS — Z3A38 38 weeks gestation of pregnancy: Secondary | ICD-10-CM | POA: Diagnosis not present

## 2018-07-24 DIAGNOSIS — Z87891 Personal history of nicotine dependence: Secondary | ICD-10-CM | POA: Diagnosis not present

## 2018-07-24 DIAGNOSIS — O9902 Anemia complicating childbirth: Secondary | ICD-10-CM | POA: Diagnosis present

## 2018-07-24 DIAGNOSIS — O134 Gestational [pregnancy-induced] hypertension without significant proteinuria, complicating childbirth: Secondary | ICD-10-CM | POA: Diagnosis present

## 2018-07-24 DIAGNOSIS — D649 Anemia, unspecified: Secondary | ICD-10-CM | POA: Diagnosis present

## 2018-07-24 DIAGNOSIS — O24420 Gestational diabetes mellitus in childbirth, diet controlled: Secondary | ICD-10-CM | POA: Diagnosis present

## 2018-07-24 DIAGNOSIS — Z3490 Encounter for supervision of normal pregnancy, unspecified, unspecified trimester: Secondary | ICD-10-CM

## 2018-07-24 HISTORY — DX: Gestational diabetes mellitus in pregnancy, unspecified control: O24.419

## 2018-07-24 LAB — GLUCOSE, CAPILLARY
Glucose-Capillary: 79 mg/dL (ref 70–99)
Glucose-Capillary: 66 mg/dL — ABNORMAL LOW (ref 70–99)
Glucose-Capillary: 57 mg/dL — ABNORMAL LOW (ref 70–99)
Glucose-Capillary: 68 mg/dL — ABNORMAL LOW (ref 70–99)

## 2018-07-24 LAB — CBC
HCT: 26.3 % — ABNORMAL LOW (ref 36.0–46.0)
Hemoglobin: 7.7 g/dL — ABNORMAL LOW (ref 12.0–15.0)
MCH: 21.5 pg — ABNORMAL LOW (ref 26.0–34.0)
MCHC: 29.3 g/dL — ABNORMAL LOW (ref 30.0–36.0)
MCV: 73.5 fL — ABNORMAL LOW (ref 80.0–100.0)
Platelets: 268 10*3/uL (ref 150–400)
RBC: 3.58 MIL/uL — ABNORMAL LOW (ref 3.87–5.11)
RDW: 15.4 % (ref 11.5–15.5)
WBC: 9.2 10*3/uL (ref 4.0–10.5)
nRBC: 0.4 % — ABNORMAL HIGH (ref 0.0–0.2)

## 2018-07-24 LAB — TYPE AND SCREEN
ABO/RH(D): O POS
Antibody Screen: NEGATIVE

## 2018-07-24 LAB — RPR: RPR Ser Ql: NONREACTIVE

## 2018-07-24 MED ORDER — OXYCODONE-ACETAMINOPHEN 5-325 MG PO TABS: 2.0000 | ORAL_TABLET | ORAL | Status: DC | PRN

## 2018-07-24 MED ORDER — FENTANYL-BUPIVACAINE-NACL 0.5-0.125-0.9 MG/250ML-% EP SOLN
12.0000 mL/h | EPIDURAL | Status: DC | PRN
  Filled 2018-07-24: qty 250

## 2018-07-24 MED ORDER — LIDOCAINE HCL (PF) 1 % IJ SOLN
INTRAMUSCULAR | Status: DC | PRN
  Administered 2018-07-24 (×2): 5 mL via EPIDURAL

## 2018-07-24 MED ORDER — OXYCODONE-ACETAMINOPHEN 5-325 MG PO TABS: 1.0000 | ORAL_TABLET | ORAL | Status: DC | PRN

## 2018-07-24 MED ORDER — LABETALOL HCL 200 MG PO TABS
200.0000 mg | ORAL_TABLET | Freq: Two times a day (BID) | ORAL | Status: DC
  Administered 2018-07-25 (×3): 200 mg via ORAL
  Filled 2018-07-24 (×3): qty 1

## 2018-07-24 MED ORDER — BUTORPHANOL TARTRATE 1 MG/ML IJ SOLN: 1.0000 mg | INTRAMUSCULAR | Status: DC | PRN

## 2018-07-24 MED ORDER — OXYTOCIN 40 UNITS IN NORMAL SALINE INFUSION - SIMPLE MED
1.0000 m[IU]/min | INTRAVENOUS | Status: DC
  Administered 2018-07-24: 2 m[IU]/min via INTRAVENOUS
  Filled 2018-07-24: qty 1000

## 2018-07-24 MED ORDER — SOD CITRATE-CITRIC ACID 500-334 MG/5ML PO SOLN: 30.0000 mL | ORAL | Status: DC | PRN

## 2018-07-24 MED ORDER — OXYTOCIN 40 UNITS IN NORMAL SALINE INFUSION - SIMPLE MED: 2.5000 [IU]/h | INTRAVENOUS | Status: DC

## 2018-07-24 MED ORDER — LACTATED RINGERS IV SOLN
INTRAVENOUS | Status: DC
  Administered 2018-07-24 (×3): via INTRAVENOUS

## 2018-07-24 MED ORDER — EPHEDRINE 5 MG/ML INJ: 10.0000 mg | INTRAVENOUS | Status: DC | PRN

## 2018-07-24 MED ORDER — DIPHENHYDRAMINE HCL 50 MG/ML IJ SOLN: 12.5000 mg | INTRAMUSCULAR | Status: DC | PRN

## 2018-07-24 MED ORDER — TERBUTALINE SULFATE 1 MG/ML IJ SOLN: 0.2500 mg | Freq: Once | INTRAMUSCULAR | Status: DC | PRN

## 2018-07-24 MED ORDER — LACTATED RINGERS IV SOLN
500.0000 mL | INTRAVENOUS | Status: DC | PRN
  Administered 2018-07-24: 500 mL via INTRAVENOUS

## 2018-07-24 MED ORDER — OXYTOCIN BOLUS FROM INFUSION
500.0000 mL | Freq: Once | INTRAVENOUS | Status: AC
  Administered 2018-07-24: 23:00:00 500 mL via INTRAVENOUS

## 2018-07-24 MED ORDER — ACETAMINOPHEN 325 MG PO TABS: 650.0000 mg | ORAL_TABLET | ORAL | Status: DC | PRN

## 2018-07-24 MED ORDER — ONDANSETRON HCL 4 MG/2ML IJ SOLN: 4.0000 mg | Freq: Four times a day (QID) | INTRAMUSCULAR | Status: DC | PRN

## 2018-07-24 MED ORDER — PHENYLEPHRINE 40 MCG/ML (10ML) SYRINGE FOR IV PUSH (FOR BLOOD PRESSURE SUPPORT): 80.0000 ug | PREFILLED_SYRINGE | INTRAVENOUS | Status: DC | PRN

## 2018-07-24 MED ORDER — LACTATED RINGERS IV SOLN: 500.0000 mL | Freq: Once | INTRAVENOUS | Status: DC

## 2018-07-24 MED ORDER — LIDOCAINE HCL (PF) 1 % IJ SOLN: 30.0000 mL | INTRAMUSCULAR | Status: DC | PRN

## 2018-07-24 MED ORDER — SODIUM CHLORIDE (PF) 0.9 % IJ SOLN
INTRAMUSCULAR | Status: DC | PRN
  Administered 2018-07-24: 12 mL/h via EPIDURAL

## 2018-07-24 MED ORDER — LACTATED RINGERS AMNIOINFUSION
INTRAVENOUS | Status: DC
  Administered 2018-07-24: 19:00:00 via INTRAUTERINE

## 2018-07-24 NOTE — Anesthesia Procedure Notes (Signed)
Epidural Patient location during procedure: OB Start time: 07/24/2018 11:15 AM End time: 07/24/2018 11:19 AM  Staffing Anesthesiologist: Leilani Able, MD Performed: anesthesiologist   Preanesthetic Checklist Completed: patient identified, site marked, surgical consent, pre-op evaluation, timeout performed, IV checked, risks and benefits discussed and monitors and equipment checked  Epidural Patient position: sitting Prep: site prepped and draped and DuraPrep Patient monitoring: continuous pulse ox and blood pressure Approach: midline Location: L3-L4 Injection technique: LOR air  Needle:  Needle type: Tuohy  Needle gauge: 17 G Needle length: 9 cm and 9 Needle insertion depth: 5 cm cm Catheter type: closed end flexible Catheter size: 19 Gauge Catheter at skin depth: 10 cm Test dose: negative and Other  Assessment Sensory level: T9 Events: blood not aspirated, injection not painful, no injection resistance, negative IV test and no paresthesia  Additional Notes Reason for block:procedure for pain

## 2018-07-24 NOTE — Anesthesia Preprocedure Evaluation (Signed)
Anesthesia Evaluation  Patient identified by MRN, date of birth, ID band Patient awake    Reviewed: Allergy & Precautions, H&P , NPO status , Patient's Chart, lab work & pertinent test results  History of Anesthesia Complications (+) PONV  Airway Mallampati: I  TM Distance: >3 FB Neck ROM: full    Dental no notable dental hx. (+) Teeth Intact   Pulmonary former smoker,    Pulmonary exam normal breath sounds clear to auscultation       Cardiovascular negative cardio ROS Normal cardiovascular exam Rhythm:regular Rate:Normal     Neuro/Psych PSYCHIATRIC DISORDERS Anxiety Depression Bipolar Disorder negative neurological ROS     GI/Hepatic negative GI ROS, Neg liver ROS,   Endo/Other  negative endocrine ROSdiabetes  Renal/GU   negative genitourinary   Musculoskeletal negative musculoskeletal ROS (+)   Abdominal Normal abdominal exam  (+)   Peds  Hematology  (+) Blood dyscrasia, anemia ,   Anesthesia Other Findings   Reproductive/Obstetrics (+) Pregnancy                             Anesthesia Physical Anesthesia Plan  ASA: II  Anesthesia Plan: Epidural   Post-op Pain Management:    Induction:   PONV Risk Score and Plan:   Airway Management Planned:   Additional Equipment:   Intra-op Plan:   Post-operative Plan:   Informed Consent: I have reviewed the patients History and Physical, chart, labs and discussed the procedure including the risks, benefits and alternatives for the proposed anesthesia with the patient or authorized representative who has indicated his/her understanding and acceptance.       Plan Discussed with:   Anesthesia Plan Comments:         Anesthesia Quick Evaluation

## 2018-07-24 NOTE — Progress Notes (Signed)
CBG 56:  Pt asymptomatic.  Pt given cranberry juice and popsicle

## 2018-07-24 NOTE — Progress Notes (Signed)
Patient ID: Brandy Hansen, female   DOB: Sep 24, 1985, 33 y.o.   MRN: [id number] Pt comfortable with epidural. +Fms VSS EFM - 140, cat 1 TOCO - ctxs q 1-22mins SVE - 4/80/-2  A/P: [id number] - latent labor         IUPC placed; pitocin per protocol        Anticipate svd

## 2018-07-24 NOTE — Progress Notes (Signed)
Patient ID: Brandy Hansen, female   DOB: Oct 21, 1985, 33 y.o.   MRN: [id number] Recurrrent late decelerations were noted with pitocin at . MVus 210-240. Pitocin was decreased back to , position changes employed. Strip improved Cervix 4-5cm Kept at for 45-45mins; mvus 180 Cat 1, 135 Cervix now 5.5cm/80/-1  Plan: increase pitocin to then if tolerates increase again till adequate mvus.           Anticipate svd

## 2018-07-24 NOTE — H&P (Signed)
Brandy Hansen is a 33 y.o.G42P1011 female presenting for iol due to [facility]. BP begun increasing in third trimester. Pt is asymptomatic. She is dated per LMP. She declined all screenings. Pregnancy has also been complicated by GDMA1 and severe anemia - received fereheme ( 2 units ) two days ago. GBS is negative OB History    Gravida  3   Para  1   Term  1   Preterm      AB  1   Living  1     SAB      TAB      Ectopic  1   Multiple      Live Births             Past Medical History:  Diagnosis Date  . Anxiety   . Bipolar 1 disorder (HCC)   . Chronic kidney disease    ? problems as child, "stretched" fine since  . Depression   . Gestational diabetes    Diet Controlled   . HPV (human papilloma virus) anogenital infection   . Mental disorder   . Ovarian cyst   . PONV (postoperative nausea and vomiting)    Past Surgical History:  Procedure Laterality Date  . CHONDROPLASTY Right 10/27/2014   Procedure: CHONDROPLASTY;  Surgeon: Brandy Geralds, MD;  Location: [facility];  Service: Orthopedics;  Laterality: Right;  . DILATION AND EVACUATION N/A 08/20/2016   Procedure: DILATATION AND EVACUATION;  Surgeon: Brandy Hart, MD;  Location: [facility];  Service: Gynecology;  Laterality: N/A;  . KNEE ARTHROSCOPY Right 10/27/2014   Procedure: ARTHROSCOPY KNEE with chondroplasty of medial femeral condyle and femoral patella joint;  Surgeon: Brandy Geralds, MD;  Location: [facility];  Service: Orthopedics;  Laterality: Right;  . LAPAROSCOPY  06/24/2011   Procedure: LAPAROSCOPY OPERATIVE;  Surgeon: Brandy Dukes, MD;  Location: [facility];  Service: Gynecology;  Laterality: Right;  operative laparoscopy and right salpingectomy with removal of ectopic pregnancy  . WRIST SURGERY     Family History: family history includes Diabetes in her mother; Hypertension in her father. Social History:  reports that she quit smoking about 7 months ago. Her smoking use included cigarettes.  She has never used smokeless tobacco. She reports previous alcohol use. She reports that she does not use drugs.     Maternal Diabetes: Yes:  Diabetes Type:  Diet controlled Genetic Screening: Declined Maternal Ultrasounds/Referrals: Normal Fetal Ultrasounds or other Referrals:  None Maternal Substance Abuse:  No Significant Maternal Medications:  None Significant Maternal Lab Results:  Lab values include: Group B Strep negative Other Comments:  None  Review of Systems  Constitutional: Positive for malaise/fatigue. Negative for chills, fever and weight loss.  Eyes: Negative for blurred vision and double vision.  Respiratory: Negative for shortness of breath.   Cardiovascular: Negative for chest pain, palpitations and leg swelling.  Gastrointestinal: Positive for abdominal pain. Negative for heartburn, nausea and vomiting.  Genitourinary: Negative for dysuria, frequency and urgency.  Skin: Negative for itching and rash.  Neurological: Negative for dizziness and headaches.  Endo/Heme/Allergies: Does not bruise/bleed easily.  Psychiatric/Behavioral: Negative for depression, hallucinations, substance abuse and suicidal ideas. The patient is nervous/anxious.    Maternal Medical History:  Reason for admission: Nausea. iol for PIH  Contractions: Onset was yesterday.   Frequency: irregular.   Perceived severity is mild.    Fetal activity: Perceived fetal activity is normal.   Last perceived fetal movement was within the past hour.  Prenatal complications: PIH.   anemia  Prenatal Complications - Diabetes: gestational. Diabetes is managed by diet.      Dilation: 3.5 Effacement (%): 80 Station: -2 Exam by:: Dr Brandy Hansen  Blood pressure (!) 153/87, pulse 74, temperature (!) 97.5 F (36.4 C), resp. rate 18, height 5\' 7"  (1.702 m), weight 81.9 kg, last menstrual period 10/29/2017. Exam Physical Exam  Prenatal labs: ABO, Rh: --/--/O POS (04/02 0800) Antibody: NEG (04/02  0800) Rubella:   RPR:    HBsAg:    HIV:    GBS: Negative (03/17 0000)   Assessment/Plan: 56IF [id number] female at 42 2/7wks for IOl due to PIH - stable Pt also with GDMA1 - check FSBS q 4 hrs Pt also with anemia - HG stable following fereheme AROM - clear Pitocin per protocol Epidural prn GBS neg Anticipate svd   Brandy Hansen W Sintia Hansen 07/24/2018, 10:57 AM

## 2018-07-24 NOTE — Progress Notes (Signed)
Patient ID: Brandy Hansen, female   DOB: 06/17/1985, 33 y.o.   MRN: [id number] Pt with no complaints. +Fms VSS EFM - 145, moderate variability, no decels; cat 2 TOCO - ctxs q 1-73mins; pitocin at SVE - 6/90/-1  A/P: G3P1011 at 38 2/7wks, PIH, GDMA1         S/p amnioinfusion          Expectant mgmt

## 2018-07-25 ENCOUNTER — Encounter (HOSPITAL_COMMUNITY): Payer: Self-pay | Admitting: *Deleted

## 2018-07-25 LAB — CBC
HCT: 22.6 % — ABNORMAL LOW (ref 36.0–46.0)
HCT: 24.5 % — ABNORMAL LOW (ref 36.0–46.0)
Hemoglobin: 6.8 g/dL — CL (ref 12.0–15.0)
Hemoglobin: 7.2 g/dL — ABNORMAL LOW (ref 12.0–15.0)
MCH: 21.2 pg — ABNORMAL LOW (ref 26.0–34.0)
MCH: 22 pg — ABNORMAL LOW (ref 26.0–34.0)
MCHC: 29.4 g/dL — ABNORMAL LOW (ref 30.0–36.0)
MCHC: 30.1 g/dL (ref 30.0–36.0)
MCV: 72.1 fL — ABNORMAL LOW (ref 80.0–100.0)
MCV: 73.1 fL — ABNORMAL LOW (ref 80.0–100.0)
Platelets: 224 10*3/uL (ref 150–400)
Platelets: 241 10*3/uL (ref 150–400)
RBC: 3.09 MIL/uL — ABNORMAL LOW (ref 3.87–5.11)
RBC: 3.4 MIL/uL — ABNORMAL LOW (ref 3.87–5.11)
RDW: 15.6 % — ABNORMAL HIGH (ref 11.5–15.5)
RDW: 15.7 % — ABNORMAL HIGH (ref 11.5–15.5)
WBC: 10.3 10*3/uL (ref 4.0–10.5)
WBC: 11.8 10*3/uL — ABNORMAL HIGH (ref 4.0–10.5)
nRBC: 0.2 % (ref 0.0–0.2)
nRBC: 0.2 % (ref 0.0–0.2)

## 2018-07-25 LAB — ABO/RH: ABO/RH(D): O POS

## 2018-07-25 MED ORDER — ZOLPIDEM TARTRATE 5 MG PO TABS: 5.0000 mg | ORAL_TABLET | Freq: Every evening | ORAL | Status: DC | PRN

## 2018-07-25 MED ORDER — ONDANSETRON HCL 4 MG PO TABS: 4.0000 mg | ORAL_TABLET | ORAL | Status: DC | PRN

## 2018-07-25 MED ORDER — ONDANSETRON HCL 4 MG/2ML IJ SOLN: 4.0000 mg | INTRAMUSCULAR | Status: DC | PRN

## 2018-07-25 MED ORDER — COCONUT OIL OIL: 1.0000 "application " | TOPICAL_OIL | Status: DC | PRN

## 2018-07-25 MED ORDER — LABETALOL HCL 200 MG PO TABS: 200.0000 mg | ORAL_TABLET | Freq: Once | ORAL | Status: DC

## 2018-07-25 MED ORDER — SIMETHICONE 80 MG PO CHEW: 80.0000 mg | CHEWABLE_TABLET | ORAL | Status: DC | PRN

## 2018-07-25 MED ORDER — IBUPROFEN 600 MG PO TABS
600.0000 mg | ORAL_TABLET | Freq: Four times a day (QID) | ORAL | Status: DC
  Administered 2018-07-25 – 2018-07-26 (×5): 600 mg via ORAL
  Filled 2018-07-25 (×5): qty 1

## 2018-07-25 MED ORDER — WITCH HAZEL-GLYCERIN EX PADS: 1.0000 "application " | MEDICATED_PAD | CUTANEOUS | Status: DC | PRN

## 2018-07-25 MED ORDER — ACETAMINOPHEN 325 MG PO TABS: 650.0000 mg | ORAL_TABLET | ORAL | Status: DC | PRN

## 2018-07-25 MED ORDER — PRENATAL MULTIVITAMIN CH
1.0000 | ORAL_TABLET | Freq: Every day | ORAL | Status: DC
  Administered 2018-07-25: 12:00:00 1 via ORAL
  Filled 2018-07-25: qty 1

## 2018-07-25 MED ORDER — DIBUCAINE 1 % RE OINT: 1.0000 "application " | TOPICAL_OINTMENT | RECTAL | Status: DC | PRN

## 2018-07-25 MED ORDER — TETANUS-DIPHTH-ACELL PERTUSSIS 5-2.5-18.5 LF-MCG/0.5 IM SUSP
0.5000 mL | Freq: Once | INTRAMUSCULAR | Status: AC
  Administered 2018-07-26: 0.5 mL via INTRAMUSCULAR
  Filled 2018-07-25: qty 0.5

## 2018-07-25 MED ORDER — BENZOCAINE-MENTHOL 20-0.5 % EX AERO
1.0000 "application " | INHALATION_SPRAY | CUTANEOUS | Status: DC | PRN
  Administered 2018-07-25: 1 via TOPICAL
  Filled 2018-07-25: qty 56

## 2018-07-25 MED ORDER — SENNOSIDES-DOCUSATE SODIUM 8.6-50 MG PO TABS
2.0000 | ORAL_TABLET | ORAL | Status: DC
  Administered 2018-07-26: 2 via ORAL
  Filled 2018-07-25: qty 2

## 2018-07-25 MED ORDER — DIPHENHYDRAMINE HCL 25 MG PO CAPS: 25.0000 mg | ORAL_CAPSULE | Freq: Four times a day (QID) | ORAL | Status: DC | PRN

## 2018-07-25 MED ORDER — FOLIC ACID 1 MG PO TABS
1.0000 mg | ORAL_TABLET | Freq: Two times a day (BID) | ORAL | Status: DC
  Administered 2018-07-25 – 2018-07-26 (×3): 1 mg via ORAL
  Filled 2018-07-25 (×3): qty 1

## 2018-07-25 NOTE — Lactation Note (Signed)
This note was copied from a baby's chart. Lactation Consultation Note  Patient Name: Brandy Hansen [id number] Date: 07/25/2018 Reason for consult: Initial assessment;Early term 37-38.6wks;Maternal endocrine disorder Type of Endocrine Disorder?: Diabetes P2, 6 hour old female infant. Infant had 2 stools and one void  since delivery. Mom is experienced at breastfeeding she breastfeed her eldest child for 3 months. Mom has DEBP at home. Per mom, infant breastfeed 25 minutes less than 1 1/2 hours prior to [facility] entering the room. LC did not observe latch at this time. LC reviewed hand expression and mom easily expressed 25 ml of colostrum infant was given 10 ml by spoon. At next feeding mom plans to breast feed first then offer back EBM. Mom knows to breastfeed according hunger cues, 8 or more times within 24 hours. LC discussed I & O. Reviewed Baby & Me book's Breastfeeding Basics.  Mom knows to call Nurse or LC if she has any questions, concerns or need assistance with latching infant to breast. Mom made aware of O/P services, breastfeeding support groups, community resources, and our phone # for post-discharge questions.  Maternal Data Formula Feeding for Exclusion: No Has patient been taught Hand Expression?: Yes(infant took 10 ml of colostrum by spoon.) Does the patient have breastfeeding experience prior to this delivery?: Yes  Feeding Feeding Type: Breast Fed  LATCH Score                   Interventions Interventions: Breast feeding basics reviewed;Skin to skin;Expressed milk;Hand express  Lactation Tools Discussed/Used WIC Program: No   Consult Status Consult Status: Follow-up Date: 07/26/18 Follow-up type: In-patient    Danelle Earthly 07/25/2018, 5:38 AM

## 2018-07-25 NOTE — Anesthesia Postprocedure Evaluation (Signed)
Anesthesia Post Note  Patient: Brandy Hansen  Procedure(s) Performed: AN AD HOC LABOR EPIDURAL     Patient location during evaluation: Mother Baby Anesthesia Type: Epidural Level of consciousness: awake and alert and oriented Pain management: pain level controlled Vital Signs Assessment: post-procedure vital signs reviewed and stable Respiratory status: spontaneous breathing Cardiovascular status: blood pressure returned to baseline Postop Assessment: no headache and able to ambulate Anesthetic complications: no    Last Vitals:  Vitals:   07/25/18 0145 07/25/18 0600  BP: 137/75 140/80  Pulse: 85 82  Resp: 17 16  Temp: 36.8 C 36.7 C  SpO2:      Last Pain:  Vitals:   07/25/18 0600  TempSrc: Oral  PainSc: 0-No pain   Pain Goal:                   [name]

## 2018-07-25 NOTE — Clinical Social Work Maternal (Signed)
CLINICAL SOCIAL WORK MATERNAL/CHILD NOTE  Patient Details  Name: Brandy Hansen MRN: [id number] Date of Birth: 06/04/1985  Date:  07/25/2018  Clinical Social Worker Initiating Note:  Josey Dettmann Irwin Date/Time: Initiated:  07/25/18/1012     Child's Name:  Brandy Hansen   Biological Parents:  Mother, Father(Brandy Hansen and Brandy Hansen DOB: 02/28/1981)   Need for Interpreter:  None   Reason for Referral:  Behavioral Health Concerns(Bipolar disorder)   Address:  [address] [place] [address]    Phone number:  [phone] (home)     Additional phone number:   Household Members/Support Persons (HM/SP):   Household Member/Support Person 1, Household Member/Support Person 2   HM/SP Name Relationship DOB or Age  HM/SP -1 Brandy Hansen FOB 02/28/1981  HM/SP -2 Brandy Hansen Son 02/26/2010  HM/SP -3        HM/SP -4        HM/SP -5        HM/SP -6        HM/SP -7        HM/SP -8          Natural Supports (not living in the home):  Immediate Family   Professional Supports:     Employment: Part-time   Type of Work: [employer]   Education:  High school graduate   Homebound arranged:    Financial Resources:  Private Insurance   Other Resources:      Cultural/Religious Considerations Which May Impact Care:   Strengths:  Ability to meet basic needs , Home prepared for child , Pediatrician chosen   Psychotropic Medications:         Pediatrician:    [place] area  Pediatrician List:   [place] [facility]  [place]    [place]    [place]    [place]    [place]      Pediatrician Fax Number:    Risk Factors/Current Problems:  Mental Health Concerns    Cognitive State:  Able to Concentrate , Alert , Linear Thinking , Insightful    Mood/Affect:  Bright , Calm , Comfortable , Interested    CSW Assessment: CSW received consult for history of bipolar disorder.  CSW met with  MOB to offer support and complete assessment.    MOB resting in bed performing skin-to-skin with infant and FOB resting on the couch. CSW introduced self and role and received verbal permission to complete assessment with FOB in the room. CSW explained reason for consult and MOB nodded in understanding. MOB stated she currently lives with FOB and her 8-year-old son in [place], [place]. MOB reported she currently works part-time at [employer]. CSW inquired about MOB's mental health history. Per MOB, she was diagnosed with bipolar when she was 16 or 17. MOB described her symptoms as having depression and mood swings. MOB stated she's been off medications for about a year and a half and reports it's been "good" without medications. MOB denied any mental health symptoms during pregnancy and appeared appropriate and in good spirits. MOB attentive to baby throughout assessment.   CSW provided education regarding the baby blues period vs. perinatal mood disorders, discussed treatment and gave resources for mental health follow up if concerns arise.  CSW recommends self-evaluation during the postpartum time period using the New Mom Checklist from Postpartum Progress and encouraged MOB to contact a medical professional if symptoms are noted at any time.  MOB denied any SI or HI and   reported having a good support system that consists of her husband and their moms.   MOB confirmed having all essential items for infant once discharged. MOB stated infant will sleep in either her crib or basinet once home. CSW provided review of Sudden Infant Death Syndrome (SIDS) precautions and safe sleeping habits.  MOB denied any further questions or concerns from CSW at this time.   CSW Plan/Description:  No Further Intervention Required/No Barriers to Discharge, Perinatal Mood and Anxiety Disorder (PMADs) Education, Sudden Infant Death Syndrome (SIDS) Education    Beatrix Breece  Irwin, LCSWA 07/25/2018, 10:34 AM 

## 2018-07-25 NOTE — Progress Notes (Signed)
Post Partum Day 1 Subjective: no complaints  Pt states able to ambulate without dizziness and feels well.  Already making milk.   Objective: Blood pressure (!) 136/91, pulse 87, temperature 98.5 F (36.9 C), temperature source Axillary, resp. rate 18, height 5\' 7"  (1.702 m), weight 81.9 kg, last menstrual period 10/29/2017, SpO2 99 %, unknown if currently breastfeeding.  Physical Exam:  General: alert and cooperative Lochia: appropriate Uterine Fundus: firm   Recent Labs    07/24/18 2355 07/25/18 0557  HGB 7.2* 6.8*  HCT 24.5* 22.6*    Assessment/Plan: Plan for discharge tomorrow  BP stable on PO labetalol BID, will continue and follow Baseline anemic and Hgb 6.8 down from 7.7 on admission.  Asymptomatic and received feraheme 3 days ago.  Has another infusion set up outpatient next week and d/w Dr. Myna Hidalgo of Heme and cannot be given any earlier than 7 days per FDA. Will just plan that outpatient and recheck CBC tomorrow   LOS: 1 day   Oliver Pila 07/25/2018, 12:23 PM

## 2018-07-26 LAB — CBC
HCT: 22.7 % — ABNORMAL LOW (ref 36.0–46.0)
Hemoglobin: 6.6 g/dL — CL (ref 12.0–15.0)
MCH: 21.8 pg — ABNORMAL LOW (ref 26.0–34.0)
MCHC: 29.1 g/dL — ABNORMAL LOW (ref 30.0–36.0)
MCV: 74.9 fL — ABNORMAL LOW (ref 80.0–100.0)
Platelets: 227 10*3/uL (ref 150–400)
RBC: 3.03 MIL/uL — ABNORMAL LOW (ref 3.87–5.11)
RDW: 15.7 % — ABNORMAL HIGH (ref 11.5–15.5)
WBC: 8.7 10*3/uL (ref 4.0–10.5)
nRBC: 0.2 % (ref 0.0–0.2)

## 2018-07-26 MED ORDER — LABETALOL HCL 200 MG PO TABS
200.0000 mg | ORAL_TABLET | Freq: Three times a day (TID) | ORAL | Status: DC
  Administered 2018-07-26: 08:00:00 200 mg via ORAL
  Filled 2018-07-26: qty 1

## 2018-07-26 MED ORDER — ACETAMINOPHEN 325 MG PO TABS: 650.0000 mg | ORAL_TABLET | ORAL | 0 refills | Status: DC | PRN

## 2018-07-26 MED ORDER — LABETALOL HCL 200 MG PO TABS: 200.0000 mg | ORAL_TABLET | Freq: Three times a day (TID) | ORAL | 0 refills | Status: DC

## 2018-07-26 NOTE — Discharge Summary (Signed)
OB Discharge Summary     Patient Name: Brandy Hansen DOB: [birth year] MRN: [id number]  Date of admission: 07/24/2018 Delivering MD: Pryor Ochoa [facility]   Date of discharge: 07/26/2018  Admitting diagnosis: PREG Intrauterine pregnancy: [redacted]w[redacted]d     Secondary diagnosis:  Active Problems:   Pregnant   SVD (spontaneous vaginal delivery)   Postpartum care following vaginal delivery  Additional problems: gestational hypertension, gestational diabetes, severe anemia     Discharge diagnosis: Term Pregnancy Delivered                                                                                                Post partum procedures:antihypertensives  Augmentation: AROM and Pitocin  Complications: None  Hospital course:  Induction of Labor With Vaginal Delivery   33 y.o. yo [id number] at [redacted]w[redacted]d was admitted to the hospital 07/24/2018 for induction of labor.  Indication for induction: Gestational hypertension.  Patient had an uncomplicated labor course as follows: Membrane Rupture Time/Date: 9:21 AM ,07/24/2018   Intrapartum Procedures: Episiotomy: None [1]                                         Lacerations:  Vaginal [6]  Patient had delivery of a Viable infant.  Information for the patient's newborn:  Brandy, Hansen [[id number]]  Delivery Method: Vag-Spont   07/24/2018  Details of delivery can be found in separate delivery note.  Patient had a postpartum course significant for anemia but was well-tolerated and did not require transfusion.  She had BP well-controlled on labetalol 200mg  po TID. Patient is discharged home 07/26/18.  Physical exam  Vitals:   07/25/18 2236 07/26/18 0632 07/26/18 0822 07/26/18 1039  BP: (!) 135/92 (!) 141/83 130/84 140/85  Pulse: 85 74 75 95  Resp: 18 18 18    Temp: 98.3 F (36.8 C) 97.7 F (36.5 C) 98.2 F (36.8 C)   TempSrc: Oral Oral Oral   SpO2: 98%     Weight:      Height:       General: alert and cooperative Lochia: appropriate Uterine  Fundus: firm  Labs: Lab Results  Component Value Date   WBC 8.7 07/26/2018   HGB 6.6 (LL) 07/26/2018   HCT 22.7 (L) 07/26/2018   MCV 74.9 (L) 07/26/2018   PLT 227 07/26/2018   CMP Latest Ref Rng & Units 07/02/2018  Glucose 70 - 99 mg/dL 75  BUN 6 - 20 mg/dL 6  Creatinine 1.28 - 1.18 mg/dL 8.67  Sodium 737 - 366 mmol/L 136  Potassium 3.5 - 5.1 mmol/L 3.9  Chloride 98 - 111 mmol/L 106  CO2 22 - 32 mmol/L 20(L)  Calcium 8.9 - 10.3 mg/dL 8.1(P)  Total Protein 6.5 - 8.1 g/dL 6.8  Total Bilirubin 0.3 - 1.2 mg/dL 0.5  Alkaline Phos 38 - 126 U/L 123  AST 15 - 41 U/L 21  ALT 0 - 44 U/L 11    Discharge instruction: per After Visit Summary and "Baby and Me Booklet".  After  visit meds:  Allergies as of 07/26/2018      Reactions   Codeine Itching   Naproxen Nausea And Vomiting, Rash   Sulfa Antibiotics Rash      Medication List    STOP taking these medications   IRON PO   iron polysaccharides 150 MG capsule Commonly known as:  NIFEREX     TAKE these medications   acetaminophen 325 MG tablet Commonly known as:  Tylenol Take 2 tablets (650 mg total) by mouth every 4 (four) hours as needed (for pain scale < 4).   labetalol 200 MG tablet Commonly known as:  NORMODYNE Take 1 tablet (200 mg total) by mouth 3 (three) times daily. What changed:    how much to take  when to take this   PRENATAL VITAMINS PO Take 1 tablet by mouth daily.       Diet: routine diet  Activity: Advance as tolerated. Pelvic rest for 6 weeks.   Outpatient follow up:4 days Follow up Appt: Future Appointments  Date Time Provider Department Center  07/29/2018 12:00 PM [facility] ROOM 2 [facility] None  08/27/2018  2:30 PM [facility] LAB 1 [facility] None  08/27/2018  3:00 PM Johney Maine, MD [facility] None   Follow up Visit:No follow-ups on file.  Postpartum contraception: IUD Mirena  Newborn Data: Live born female  Birth Weight: 6 lb 6.3 oz (2900 g) APGAR: 9, 9  Newborn Delivery    Birth date/time:  07/24/2018 22:40:00 Delivery type:  Vaginal, Spontaneous     Baby Feeding: Bottle and Breast Disposition:home with mother   07/26/2018 Oliver Pila, MD

## 2018-07-26 NOTE — Progress Notes (Signed)
Post Partum Day 2 Subjective: no complaints, up ad lib and tolerating PO  Pt still feels fine and denies any dizziness or anemia sx. Requesting d/c home  Objective: Blood pressure 140/85, pulse 95, temperature 98.2 F (36.8 C), temperature source Oral, resp. rate 18, height 5\' 7"  (1.702 m), weight 81.9 kg, last menstrual period 10/29/2017, SpO2 98 %, unknown if currently breastfeeding.  Physical Exam:  General: alert and cooperative Lochia: appropriate Uterine Fundus: firm   Recent Labs    07/25/18 0557 07/26/18 0426  HGB 6.8* 6.6*  HCT 22.6* 22.7*    Assessment/Plan: Discharge home  Hgb stable and asymptomatic so no transfusion indicated. She has her second infusion scheduled for 07/29/18 and will keep that.(7 days after first) BP stable on labetalol 200mg  po TID. Will continue and call BP to office from infusion center 4/7.  She is going to see if can also borrow her mother's BP cuff for calling in pressures to avoid office visits with COVID Planning IUD   LOS: 2 days   Oliver Pila 07/26/2018, 10:52 AM

## 2018-07-26 NOTE — Lactation Note (Signed)
This note was copied from a baby's chart. Lactation Consultation Note  Patient Name: Brandy Hansen EGBTD'V Date: 07/26/2018 Reason for consult: Follow-up assessment;Early term 37-38.6wks Type of Endocrine Disorder?: Diabetes  P2 mother whose infant is now 70 hours old.  Mother breast fed her first child (now 35 years old) for 3 months.  Baby was asleep on mother's chest when I arrived.  Mother had no questions related to breast feeding.  She has supplemented some with formula.  Offered to assist/observe with latching prior to discharge if mother desires.  She stated she will "work it out."  Mother has a DEBP and a manual pump for home use.  Engorgement prevention/treatment discussed.  She has our OP phone number for questions after discharge.  Father present.  Family is ready for discharge today.   Maternal Data Formula Feeding for Exclusion: No Has patient been taught Hand Expression?: Yes Does the patient have breastfeeding experience prior to this delivery?: Yes  Feeding Feeding Type: Bottle Fed - Formula Nipple Type: Slow - flow  LATCH Score                   Interventions    Lactation Tools Discussed/Used     Consult Status Consult Status: Complete Date: 07/26/18 Follow-up type: Call as needed    [name] R Marteze Vecchio 07/26/2018, 9:52 AM

## 2018-07-28 ENCOUNTER — Other Ambulatory Visit (HOSPITAL_COMMUNITY): Payer: Self-pay | Admitting: *Deleted

## 2018-07-29 ENCOUNTER — Other Ambulatory Visit: Payer: Self-pay

## 2018-07-29 ENCOUNTER — Ambulatory Visit (HOSPITAL_COMMUNITY)
Admission: RE | Admit: 2018-07-29 | Discharge: 2018-07-29 | Disposition: A | Discharge status: Home / Self Care | Payer: 59 | Source: Ambulatory Visit | Attending: Obstetrics and Gynecology | Admitting: Obstetrics and Gynecology

## 2018-07-29 DIAGNOSIS — D509 Iron deficiency anemia, unspecified: Secondary | ICD-10-CM | POA: Insufficient documentation

## 2018-07-29 MED ORDER — SODIUM CHLORIDE 0.9 % IV SOLN
510.0000 mg | INTRAVENOUS | Status: DC
  Administered 2018-07-29: 510 mg via INTRAVENOUS
  Filled 2018-07-29: qty 510

## 2018-08-27 ENCOUNTER — Inpatient Hospital Stay: Payer: 59 | Attending: Hematology

## 2018-08-27 ENCOUNTER — Inpatient Hospital Stay: Payer: 59 | Admitting: Hematology

## 2019-04-24 DIAGNOSIS — Z8639 Personal history of other endocrine, nutritional and metabolic disease: Secondary | ICD-10-CM

## 2019-04-24 HISTORY — DX: Personal history of other endocrine, nutritional and metabolic disease: Z86.39

## 2019-04-29 ENCOUNTER — Ambulatory Visit: Payer: 59 | Admitting: Adult Health Nurse Practitioner

## 2019-04-29 ENCOUNTER — Encounter: Payer: Self-pay | Admitting: Adult Health Nurse Practitioner

## 2019-04-29 ENCOUNTER — Other Ambulatory Visit: Payer: Self-pay

## 2019-04-29 VITALS — BP 140/80 | HR 148 | Temp 98.7°F | Ht 66.0 in | Wt 138.0 lb

## 2019-04-29 DIAGNOSIS — Z23 Encounter for immunization: Secondary | ICD-10-CM

## 2019-04-29 DIAGNOSIS — E049 Nontoxic goiter, unspecified: Secondary | ICD-10-CM | POA: Diagnosis not present

## 2019-04-29 DIAGNOSIS — R42 Dizziness and giddiness: Secondary | ICD-10-CM | POA: Diagnosis not present

## 2019-04-29 DIAGNOSIS — G4709 Other insomnia: Secondary | ICD-10-CM

## 2019-04-29 DIAGNOSIS — R000 Tachycardia, unspecified: Secondary | ICD-10-CM | POA: Diagnosis not present

## 2019-04-29 DIAGNOSIS — E040 Nontoxic diffuse goiter: Secondary | ICD-10-CM | POA: Insufficient documentation

## 2019-04-29 DIAGNOSIS — E559 Vitamin D deficiency, unspecified: Secondary | ICD-10-CM

## 2019-04-29 DIAGNOSIS — D649 Anemia, unspecified: Secondary | ICD-10-CM

## 2019-04-29 DIAGNOSIS — R87613 High grade squamous intraepithelial lesion on cytologic smear of cervix (HGSIL): Secondary | ICD-10-CM | POA: Insufficient documentation

## 2019-04-29 DIAGNOSIS — Z7689 Persons encountering health services in other specified circumstances: Secondary | ICD-10-CM

## 2019-04-29 LAB — POCT URINALYSIS DIP (MANUAL ENTRY)
Bilirubin, UA: NEGATIVE
Glucose, UA: NEGATIVE mg/dL
Ketones, POC UA: NEGATIVE mg/dL
Leukocytes, UA: NEGATIVE
Nitrite, UA: NEGATIVE
Protein Ur, POC: NEGATIVE mg/dL
Spec Grav, UA: 1.025 (ref 1.010–1.025)
Urobilinogen, UA: 0.2 E.U./dL
pH, UA: 5.5 (ref 5.0–8.0)

## 2019-04-29 LAB — GLUCOSE, POCT (MANUAL RESULT ENTRY): POC Glucose: 187 mg/dl — AB (ref 70–99)

## 2019-04-29 MED ORDER — QUETIAPINE FUMARATE 100 MG PO TABS: ORAL_TABLET | ORAL | 0 refills | Status: DC

## 2019-04-29 MED ORDER — CLONAZEPAM 0.5 MG PO TABS: 0.5000 mg | ORAL_TABLET | Freq: Two times a day (BID) | ORAL | 1 refills | Status: DC | PRN

## 2019-04-29 NOTE — Patient Instructions (Signed)
° ° ° °  If you have lab work done today you will be contacted with your lab results within the next 2 weeks.  If you have not heard from us then please contact us. The fastest way to get your results is to register for My Chart. ° ° °IF you received an x-ray today, you will receive an invoice from [facility]. Please contact [facility] at [phone] with questions or concerns regarding your invoice.  ° °IF you received labwork today, you will receive an invoice from [facility]. Please contact [facility] at [phone] with questions or concerns regarding your invoice.  ° °Our billing staff will not be able to assist you with questions regarding bills from these companies. ° °You will be contacted with the lab results as soon as they are available. The fastest way to get your results is to activate your My Chart account. Instructions are located on the last page of this paperwork. If you have not heard from us regarding the results in 2 weeks, please contact this office. °  ° ° ° °

## 2019-04-29 NOTE — Progress Notes (Signed)
Chief Complaint  Patient presents with  . Establish Care    weekness in both leg. Numbness in all finger tips, fatigue, senstive to light going to the bathroom more ofter x1 yr but has gotten very intense.  . score 15    HPI   Patient is a 34 year old, married, mother of two who presents today to establish care and for episodes of weakness in her legs, diaphoresis, tachycardia, dizziness, fatigue, numbness to fingers, increased urination, the feeling of almost passing out, and photophobia.  Episodes have existed for over 1 year. Per extensive chart review, she has had these symptoms without the level of intensity and severity described.  Lab review from previous notes was not abnormal with the exception of anemia.  Hx of anemia during recent pregnancy ( 07/2018) requiring transfusions with a critical Hgb level at delivery.  Notes that she has had anemia with her pregnancies, 3 miscarriages.  She does feel anxious.  Endorses insomnia.  Probably gets anywhere from 1-4 hours of sleep a night per her report.  She does have a hx of Bipolar I previously treated with Seroquel. She has been off this medication for at least 1.5 years.  She is amenable to restarting medication for sleep purposes.  Reports excessive feeling of fatigue but unable to get to sleep or stay asleep (will have interrupted sleep with frequent urination).    When she has the constellation of symptoms, reports they are present intermittently for a period of 7-8 days which she cannot predict or provoke.  Eventually sympathetic symptoms resolve and she is left with significant, exhausting fatigue.  She denies any stress beyond what she is used to--she reports great familial support with her parents, husband's parents, and family.  She works at [employer] which is stressful but is unchanged from her usual experience over the past year.    Her episodic syndrome is not provoked or improved by any change in diet, exercise, illness, or other  change in lifestyle she can identify.   Her surgical  hx is significant for an operation to her kidney at age 59 (does not recall).  Hx of depression as a teenager.   Denies current ETOH or drug use including amphetamines or THC.   She does have a goiter noted in hx and per patient.  Reports she has never had an Ultrasound, CT imaging, or biopsy.  States that many thyroid tests were completed in the past that were normal and no clinical recommendation was made for her to have thyroid further examined.  She has noted her goiter has increased in size since diagnosed > 3 years ago.   Family Hx:   Mother-Breast/Ovarian Ca-still living Father--Prostate Cancer, resistant Hypertension Siblings : Healthy Paternal GM:  Leukemia Maternal GM: MI       Problem List    Problem List: 2021-01: HSIL on Pap smear of cervix 2021-01: Simple goiter 2020-04: Postpartum care following vaginal delivery 2020-04: Pregnant 2020-04: SVD (spontaneous vaginal delivery) 2018-04: Placenta, retained 2014-08: Bipolar disorder in full remission ([place]) 2013-03: History of ectopic pregnancy   Allergies   is allergic to codeine; naproxen; and sulfa antibiotics.  Medications    Current Outpatient Medications:  .  acetaminophen (TYLENOL) 325 MG tablet, Take 2 tablets (650 mg total) by mouth every 4 (four) hours as needed (for pain scale < 4)., Disp: 30 tablet, Rfl: 0 .  labetalol (NORMODYNE) 200 MG tablet, Take 1 tablet (200 mg total) by mouth 3 (three) times daily. (Patient not  taking: Reported on 04/29/2019), Disp: 90 tablet, Rfl: 0 .  Prenatal Multivit-Min-Fe-FA (PRENATAL VITAMINS PO), Take 1 tablet by mouth daily., Disp: , Rfl:    Review of Systems    Review of Systems  Constitution: Positive for diaphoresis, malaise/fatigue, night sweats and weight loss.  Eyes: Positive for blurred vision and photophobia.  Cardiovascular: Positive for near-syncope and palpitations.  Respiratory: Positive for shortness of  breath.   Endocrine: Positive for heat intolerance and polyuria.  Skin: Positive for dry skin and itching.  Musculoskeletal: Positive for back pain, muscle weakness and myalgias.  Gastrointestinal: Positive for change in bowel habit, nausea and vomiting.  Genitourinary: Positive for frequency and nocturia.  Neurological: Positive for disturbances in coordination, dizziness, headaches, light-headedness, numbness, paresthesias, sensory change and weakness.  Psychiatric/Behavioral: Negative for substance abuse and suicidal ideas. The patient has insomnia and is nervous/anxious.        Physical Exam:   Physical Exam  Constitutional: She is oriented to person, place, and time. She appears well-nourished.  HENT:  Head: Normocephalic.  Eyes: Pupils are equal, round, and reactive to light. Conjunctivae and EOM are normal.  Neck: No JVD present. Thyromegaly present.  Cardiovascular: A regularly irregular rhythm present. Frequent extrasystoles are present. Tachycardia present. PMI is displaced.  Pulmonary/Chest: Effort normal and breath sounds normal.  Musculoskeletal:        General: Edema present.     Cervical back: Neck supple.  Neurological: She is alert and oriented to person, place, and time. She displays tremor. She exhibits abnormal muscle tone.  Skin: Skin is warm and intact. She is diaphoretic. There is erythema.  Flushed skin to LE with non-pitting +1 edema.  Psychiatric: Judgment and thought content normal. Her mood appears anxious. She is agitated. Cognition and memory are normal.  Nursing note and vitals reviewed.   Lab Review   Reviewed most recent labs in 07/2018 when she gave birth Abnormal/Critical Hgb  Chart Reviewed from 2013 with Care Everywhere.  For the last 3 years her non-OB care has shown Vitals significant with Tachycardia at nearly every visit. .   Assessment & Plan:   1. Establishing care with new doctor, encounter for   2. Need for prophylactic vaccination  and inoculation against influenza   3. Tachycardia with heart rate 121-140 beats per minute   4. Goiter   5. Other insomnia   6. Dizziness and giddiness   7. Anemia, unspecified type   8. Hypovitaminosis D      Orders Placed This Encounter  Procedures  . US THYROID  . Flu vaccine  . CBC  . CMP14+EGFR  . Thyroid Panel With TSH  . CBC with Differential/Platelet  . Iron, TIBC and Ferritin Panel  . B12 and Folate Panel  . VITAMIN D 25 Hydroxy (Vit-D Deficiency, Fractures)  . Thyroid antibodies  . Urine Microscopic  . Ambulatory referral to Cardiology  . Ambulatory referral to Endocrinology  . POCT urinalysis dipstick  . POCT glucose (manual entry)  . EKG 12-Lead    Meds ordered this encounter  Medications  . clonazePAM (KLONOPIN) 0.5 MG tablet    Sig: Take 1 tablet (0.5 mg total) by mouth 2 (two) times daily as needed for anxiety.    Dispense:  20 tablet    Refill:  1  . QUEtiapine (SEROQUEL) 100 MG tablet    Sig: 1-2 tabs at night    Dispense:  60 tablet    Refill:  0     Differential includes: WPW -Hyperthyroid -  Narrow QRS syndrome -Pheochromocytoma -Anxiety  Reviewed the patient's hx and symptoms extensively with Dr. Merri Ray.     Glyn Ade, NP

## 2019-04-30 LAB — CBC WITH DIFFERENTIAL/PLATELET
Basophils Absolute: 0 10*3/uL (ref 0.0–0.2)
Basos: 0 %
EOS (ABSOLUTE): 0 10*3/uL (ref 0.0–0.4)
Eos: 1 %
Hematocrit: 34.8 % (ref 34.0–46.6)
Hemoglobin: 11.7 g/dL (ref 11.1–15.9)
Immature Grans (Abs): 0 10*3/uL (ref 0.0–0.1)
Immature Granulocytes: 0 %
Lymphocytes Absolute: 1.7 10*3/uL (ref 0.7–3.1)
Lymphs: 24 %
MCH: 25.8 pg — ABNORMAL LOW (ref 26.6–33.0)
MCHC: 33.6 g/dL (ref 31.5–35.7)
MCV: 77 fL — ABNORMAL LOW (ref 79–97)
Monocytes Absolute: 0.5 10*3/uL (ref 0.1–0.9)
Monocytes: 8 %
Neutrophils Absolute: 4.8 10*3/uL (ref 1.4–7.0)
Neutrophils: 67 %
Platelets: 307 10*3/uL (ref 150–450)
RBC: 4.54 x10E6/uL (ref 3.77–5.28)
RDW: 13.1 % (ref 11.7–15.4)
WBC: 7.1 10*3/uL (ref 3.4–10.8)

## 2019-04-30 LAB — IRON,TIBC AND FERRITIN PANEL
Ferritin: 36 ng/mL (ref 15–150)
Iron Saturation: 16 % (ref 15–55)
Iron: 60 ug/dL (ref 27–159)
Total Iron Binding Capacity: 387 ug/dL (ref 250–450)
UIBC: 327 ug/dL (ref 131–425)

## 2019-04-30 LAB — URINALYSIS, MICROSCOPIC ONLY
Bacteria, UA: NONE SEEN
Casts: NONE SEEN /lpf

## 2019-04-30 LAB — CMP14+EGFR
ALT: 26 IU/L (ref 0–32)
AST: 24 IU/L (ref 0–40)
Albumin/Globulin Ratio: 1.6 (ref 1.2–2.2)
Albumin: 4.1 g/dL (ref 3.8–4.8)
Alkaline Phosphatase: 90 IU/L (ref 39–117)
BUN/Creatinine Ratio: 28 — ABNORMAL HIGH (ref 9–23)
BUN: 11 mg/dL (ref 6–20)
Bilirubin Total: 0.4 mg/dL (ref 0.0–1.2)
CO2: 24 mmol/L (ref 20–29)
Calcium: 9.9 mg/dL (ref 8.7–10.2)
Chloride: 105 mmol/L (ref 96–106)
Creatinine, Ser: 0.39 mg/dL — ABNORMAL LOW (ref 0.57–1.00)
GFR calc Af Amer: 157 mL/min/{1.73_m2} (ref >59)
GFR calc non Af Amer: 137 mL/min/{1.73_m2} (ref >59)
Globulin, Total: 2.6 g/dL (ref 1.5–4.5)
Glucose: 86 mg/dL (ref 65–99)
Potassium: 4.5 mmol/L (ref 3.5–5.2)
Sodium: 140 mmol/L (ref 134–144)
Total Protein: 6.7 g/dL (ref 6.0–8.5)

## 2019-04-30 LAB — THYROID ANTIBODIES
Thyroglobulin Antibody: <1 IU/mL (ref 0.0–0.9)
Thyroperoxidase Ab SerPl-aCnc: 355 IU/mL — ABNORMAL HIGH (ref 0–34)

## 2019-04-30 LAB — THYROID PANEL WITH TSH
Free Thyroxine Index: >13.9 — ABNORMAL HIGH (ref 1.2–4.9)
T3 Uptake Ratio: 56 % — ABNORMAL HIGH (ref 24–39)
T4, Total: >24.9 ug/dL (ref 4.5–12.0)
TSH: <0.005 u[IU]/mL — ABNORMAL LOW (ref 0.450–4.500)

## 2019-04-30 LAB — B12 AND FOLATE PANEL
Folate: 16.4 ng/mL (ref >3.0)
Vitamin B-12: 691 pg/mL (ref 232–1245)

## 2019-04-30 LAB — VITAMIN D 25 HYDROXY (VIT D DEFICIENCY, FRACTURES): Vit D, 25-Hydroxy: 29.7 ng/mL — ABNORMAL LOW (ref 30.0–100.0)

## 2019-05-07 ENCOUNTER — Other Ambulatory Visit: Payer: Self-pay

## 2019-05-11 ENCOUNTER — Other Ambulatory Visit: Payer: Self-pay

## 2019-05-11 ENCOUNTER — Ambulatory Visit: Payer: 59 | Admitting: Internal Medicine

## 2019-05-11 ENCOUNTER — Encounter: Payer: Self-pay | Admitting: Internal Medicine

## 2019-05-11 VITALS — BP 120/70 | HR 130 | Ht 66.0 in | Wt 139.0 lb

## 2019-05-11 DIAGNOSIS — E040 Nontoxic diffuse goiter: Secondary | ICD-10-CM | POA: Diagnosis not present

## 2019-05-11 DIAGNOSIS — E0590 Thyrotoxicosis, unspecified without thyrotoxic crisis or storm: Secondary | ICD-10-CM | POA: Diagnosis not present

## 2019-05-11 LAB — T4, FREE: Free T4: 3.83 ng/dL — ABNORMAL HIGH (ref 0.60–1.60)

## 2019-05-11 LAB — TSH: TSH: <0.01 u[IU]/mL — ABNORMAL LOW (ref 0.35–4.50)

## 2019-05-11 LAB — T3, FREE: T3, Free: 13 pg/mL — ABNORMAL HIGH (ref 2.3–4.2)

## 2019-05-11 MED ORDER — PROPRANOLOL HCL ER 80 MG PO CP24: 80.0000 mg | ORAL_CAPSULE | Freq: Every day | ORAL | 3 refills | Status: DC

## 2019-05-11 NOTE — Progress Notes (Signed)
Patient ID: Brandy Hansen, female   DOB: 02/15/1986, 34 y.o.   MRN: [id number]   This visit occurred during the SARS-CoV-2 public health emergency.  Safety protocols were in place, including screening questions prior to the visit, additional usage of staff PPE, and extensive cleaning of exam room while observing appropriate contact time as indicated for disinfecting solutions.   HPI  Brandy Hansen is a 34 y.o.-year-old female, referred by Royal Hawthorn, NP, for evaluation and management of thyrotoxicosis and goiter.  Patient describes that for the last year, she has been feeling more hyperactive, lost almost 30 lbs, has heat intolerance, tremors, itching, SOB, palpitations.  These symptoms worsened in the last 3 months.  She finally presented to see her PCP on 04/29/2019 and thyroid tests showed thyrotoxicosis.  She was immediately referred to endocrinology.  I reviewed pt's thyroid tests: Lab Results  Component Value Date   TSH <0.005 (L) 04/29/2019   Component     Latest Ref Rng & Units 04/29/2019  TSH     0.450 - 4.500 uIU/mL <0.005 (L)  Thyroxine (T4)     4.5 - 12.0 ug/dL >59.9 (HH)  T3 Uptake Ratio     24 - 39 % 56 (H)  Free Thyroxine Index     1.2 - 4.9 >13.9 (H)   Antithyroid antibodies: 04/29/2019: ATA antibodies <1, TPO antibodies 355 (0-34) No results found for: TSI  Thyroid ultrasound (10/07/2012): Mildly, enlarged, homogeneous thyroid gland  She complains of dysphagia, occasional choking with saliva, not food, + SOB with laying down.  She denies hoarseness.  She also complains of - fatigue - insomnia (chronic) >> on Seroquel low dose for sleep. - excessive sweating/heat intolerance - tremors - anxiety (she has a history of bipolar disorder but this improved with H) - palpitations - hyperdefecation - weight loss - easy bruising  Pt does not have a FH of thyroid ds. No FH of thyroid cancer. No h/o radiation tx to head or neck.Mother with DM1.  No seaweed  or kelp, no recent contrast studies. No steroid use. No herbal supplements. No Biotin use.   Pt. also has a history of anemia with a hemoglobin of 6.6!  Last pregnancy 07/2018. She has 2 children.  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  ROS: Constitutional: + see HPI, + excessive urination and nocturia Eyes: + Blurry vision, no xerophthalmia, + R eye shooting pain in last year ENT: no sore throat, + see HPI Cardiovascular: no CP/+ SOB/+ palpitations/+ ankle swelling Respiratory: no cough/+ SOB Gastrointestinal: no N/V/+ D/no C Musculoskeletal: + Both: Muscle/joint aches Skin: no rashes, plan, + easy bruising Neurological: + tremors/no numbness/tingling/dizziness, + headache Psychiatric: + depression/+ anxiety + Low libido  Past Medical History:  Diagnosis Date  . Anemia   . Anxiety   . Arthritis   . Bipolar 1 disorder (HCC)   . Chronic kidney disease    ? problems as child, "stretched" fine since  . Depression   . Gestational diabetes    Diet Controlled   . HPV (human papilloma virus) anogenital infection   . Hypertension   . Mental disorder   . Ovarian cyst   . PONV (postoperative nausea and vomiting)   . SVD (spontaneous vaginal delivery) 07/24/2018   Past Surgical History:  Procedure Laterality Date  . CHONDROPLASTY Right 10/27/2014   Procedure: CHONDROPLASTY;  Surgeon: Jodi Geralds, MD;  Location: [facility];  Service: Orthopedics;  Laterality: Right;  . DILATION AND EVACUATION N/A 08/20/2016   Procedure: DILATATION AND EVACUATION;  Surgeon: Sanjuana Kava, MD;  Location: [facility];  Service: Gynecology;  Laterality: N/A;  . KNEE ARTHROSCOPY Right 10/27/2014   Procedure: ARTHROSCOPY KNEE with chondroplasty of medial femeral condyle and femoral patella joint;  Surgeon: Dorna Leitz, MD;  Location: [place];  Service: Orthopedics;  Laterality: Right;  . LAPAROSCOPY  06/24/2011   Procedure: LAPAROSCOPY  OPERATIVE;  Surgeon: Guss Bunde, MD;  Location: [facility];  Service: Gynecology;  Laterality: Right;  operative laparoscopy and right salpingectomy with removal of ectopic pregnancy  . WRIST SURGERY     Social History   Socioeconomic History  . Marital status: Married    Spouse name: Not on file  . Number of children: 2: 9 years and 9 months in 04/2019  . Years of education: Not on file  . Highest education level: Not on file  Occupational History  .  Varies diet [  Tobacco Use  . Smoking status: Former Smoker, now vapes    Types: Cigarettes    Quit date: 11/23/2017    Years since quitting: 1.4  . Smokeless tobacco: Never Used  Substance and Sexual Activity  . Alcohol use:     Comment: rarely, beer  . Drug use: No   Social Determinants of Health   Financial Resource Strain:   . Difficulty of Paying Living Expenses: Not on file  Food Insecurity:   . Worried About Charity fundraiser in the Last Year: Not on file  . Ran Out of Food in the Last Year: Not on file  Transportation Needs:   . Lack of Transportation (Medical): Not on file  . Lack of Transportation (Non-Medical): Not on file  Physical Activity:   . Days of Exercise per Week: Not on file  . Minutes of Exercise per Session: Not on file  Stress:   . Feeling of Stress : Not on file  Social Connections:   . Frequency of Communication with Friends and Family: Not on file  . Frequency of Social Gatherings with Friends and Family: Not on file  . Attends Religious Services: Not on file  . Active Member of Clubs or Organizations: Not on file  . Attends Archivist Meetings: Not on file  . Marital Status: Not on file  Intimate Partner Violence:   . Fear of Current or Ex-Partner: Not on file  . Emotionally Abused: Not on file  . Physically Abused: Not on file  . Sexually Abused: Not on file   Current Outpatient Medications on File Prior to Visit  Medication Sig Dispense Refill  . acetaminophen (TYLENOL) 325  MG tablet Take 2 tablets (650 mg total) by mouth every 4 (four) hours as needed (for pain scale < 4). 30 tablet 0  . clonazePAM (KLONOPIN) 0.5 MG tablet Take 1 tablet (0.5 mg total) by mouth 2 (two) times daily as needed for anxiety. 20 tablet 1  . QUEtiapine (SEROQUEL) 100 MG tablet 1-2 tabs at night 60 tablet 0   No current facility-administered medications on file prior to visit.   Allergies  Allergen Reactions  . Codeine Itching  . Naproxen Nausea And Vomiting and Rash  . Sulfa Antibiotics Rash   Family History  Problem Relation Age of Onset  . Diabetes Mother   . Cancer Mother   . Hypertension Father   . Cancer Father   . Hyperlipidemia Father   . Cancer Maternal Grandmother   .  Hyperlipidemia Maternal Grandmother   . Cancer Maternal Grandfather   . Cancer Paternal Grandmother   . Cancer Paternal Grandfather   . Stroke Paternal Grandfather   . Anesthesia problems Neg Hx    PE: BP 120/70   Pulse (!) 130   Ht 5\' 6"  (1.676 m)   Wt 139 lb (63 kg)   LMP 04/27/2019   SpO2 98%   BMI 22.44 kg/m  Wt Readings from Last 3 Encounters:  05/11/19 139 lb (63 kg)  04/29/19 138 lb (62.6 kg)  07/29/18 162 lb 9 oz (73.7 kg)   Constitutional: Normal weight, in NAD Eyes: PERRLA, EOMI, no exophthalmos, no lid lag, no stare ENT: moist mucous membranes, + symmetrical soft thyromegaly, no thyroid bruits, no cervical lymphadenopathy Cardiovascular: Tachycardia, RR, No MRG, + very mild mild periankle edema Respiratory: CTA B Gastrointestinal: abdomen soft, NT, ND, BS+ Musculoskeletal: no deformities, strength intact in all 4 Skin: moist, warm, no rashes Neurological: + tremor with outstretched hands, DTR normal in all 4  ASSESSMENT: 1. Thyrotoxicosis  2. Goiter  PLAN:  1. Patient with a recently found low TSH, with history of proximately 1 year of thyrotoxic sxs: 30-pound weight loss, heat intolerance, hyperdefecation, tremors, palpitations, anxiety.  - she does not appear to have  exogenous causes for the low TSH.  - We discussed that possible causes of thyrotoxicosis are:  09/28/18 Graves ds (high suspicion due to timeline and severity of symptoms) . Thyroiditis . toxic multinodular goiter/ toxic adenoma (I cannot feel nodules at palpation of her thyroid and her thyroid ultrasound from 2020 did not show any nodules). - will check the TSH, fT3 and fT4 and also add thyroid stimulating antibodies to screen for Graves' disease.  - If the tests remain abnormal, we may need an uptake and scan to differentiate between the 3 above possible etiologies  - we discussed about possible modalities of treatment for the above conditions, to include methimazole use, radioactive iodine ablation or (last resort) surgery.  RAI treatment is out of the question for her due to having small children at home.  We decided to start methimazole after the results come back and she would be open to surgery if needed in the future.  We discussed about possible side effects of methimazole including low white blood cell counts and high liver enzymes and when to contact me. - we may need to do thyroid ultrasound depending on the results of the uptake and scan (if a cold nodule is present) - she does have tachycardia and I suggested propranolol CR 80 mg daily - no signs of Graves' ophthalmopathy: she does not have any double vision, blurry vision,  chemosis, but she does have right eye pain - I advised her to join my chart to communicate easier - RTC in 3 months, but likely sooner for repeat labs  2. Goiter -non-nodular goiter per review of her thyroid ultrasound from 2014 -She has neck compression symptoms -Possibly related to inflammation in the setting of Graves' disease -We may need to proceed with thyroidectomy depending on how she responds to methimazole  Component     Latest Ref Rng & Units 05/11/2019  TSH     0.35 - 4.50 uIU/mL <0.01 Repeated and verified X2. (L)  T4,Free(Direct)     0.60 - 1.60 ng/dL  05/13/2019 (H)  Triiodothyronine,Free,Serum     2.3 - 4.2 pg/mL 13.0 (H)  TSI     <140 % baseline 145 (H)  TFTs still in the thyrotoxic range.  TSI's elevated.  We will start methimazole 10 mg twice a day and repeat her test in 1 month.  Carlus Pavlov, MD PhD [facility]

## 2019-05-11 NOTE — Patient Instructions (Addendum)
Please stop at the lab.  Start Propranolol 80 mg daily.  Please come back for a follow-up appointment in 3 months.   Hyperthyroidism  Hyperthyroidism is when the thyroid gland is too active (overactive). The thyroid gland is a small gland located in the lower front part of the neck, just in front of the windpipe (trachea). This gland makes hormones that help control how the body uses food for energy (metabolism) as well as how the heart and brain function. These hormones also play a role in keeping your bones strong. When the thyroid is overactive, it produces too much of a hormone called thyroxine. What are the causes? This condition may be caused by:  Graves' disease. This is a disorder in which the body's disease-fighting system (immune system) attacks the thyroid gland. This is the most common cause.  Inflammation of the thyroid gland.  A tumor in the thyroid gland.  Use of certain medicines, including: ? Prescription thyroid hormone replacement. ? Herbal supplements that mimic thyroid hormones. ? Amiodarone therapy.  Solid or fluid-filled lumps within your thyroid gland (thyroid nodules).  Taking in a large amount of iodine from foods or medicines. What increases the risk? You are more likely to develop this condition if:  You are female.  You have a family history of thyroid conditions.  You smoke tobacco.  You use a medicine called lithium.  You take medicines that affect the immune system (immunosuppressants). What are the signs or symptoms? Symptoms of this condition include:  Nervousness.  Inability to tolerate heat.  Unexplained weight loss.  Diarrhea.  Change in the texture of hair or skin.  Heart skipping beats or making extra beats.  Rapid heart rate.  Loss of menstruation.  Shaky hands.  Fatigue.  Restlessness.  Sleep problems.  Enlarged thyroid gland or a lump in the thyroid (nodule). You may also have symptoms of Graves' disease,  which may include:  Protruding eyes.  Dry eyes.  Red or swollen eyes.  Problems with vision. How is this diagnosed? This condition may be diagnosed based on:  Your symptoms and medical history.  A physical exam.  Blood tests.  Thyroid ultrasound. This test involves using sound waves to produce images of the thyroid gland.  A thyroid scan. A radioactive substance is injected into a vein, and images show how much iodine is present in the thyroid.  Radioactive iodine uptake test (RAIU). A small amount of radioactive iodine is given by mouth to see how much iodine the thyroid absorbs after a certain amount of time. How is this treated? Treatment depends on the cause and severity of the condition. Treatment may include:  Medicines to reduce the amount of thyroid hormone your body makes.  Radioactive iodine treatment (radioiodine therapy). This involves swallowing a small dose of radioactive iodine, in capsule or liquid form, to kill thyroid cells.  Surgery to remove part or all of your thyroid gland. You may need to take thyroid hormone replacement medicine for the rest of your life after thyroid surgery.  Medicines to help manage your symptoms. Follow these instructions at home:   Take over-the-counter and prescription medicines only as told by your health care provider.  Do not use any products that contain nicotine or tobacco, such as cigarettes and e-cigarettes. If you need help quitting, ask your health care provider.  Follow any instructions from your health care provider about diet. You may be instructed to limit foods that contain iodine.  Keep all follow-up visits as told  by your health care provider. This is important. ? You will need to have blood tests regularly so that your health care provider can monitor your condition. Contact a health care provider if:  Your symptoms do not get better with treatment.  You have a fever.  You are taking thyroid hormone  replacement medicine and you: ? Have symptoms of depression. ? Feel like you are tired all the time. ? Gain weight. Get help right away if:  You have chest pain.  You have decreased alertness or a change in your awareness.  You have abdominal pain.  You feel dizzy.  You have a rapid heartbeat.  You have an irregular heartbeat.  You have difficulty breathing. Summary  The thyroid gland is a small gland located in the lower front part of the neck, just in front of the windpipe (trachea).  Hyperthyroidism is when the thyroid gland is too active (overactive) and produces too much of a hormone called thyroxine.  The most common cause is Graves' disease, a disorder in which your immune system attacks the thyroid gland.  Hyperthyroidism can cause various symptoms, such as unexplained weight loss, nervousness, inability to tolerate heat, or changes in your heartbeat.  Treatment may include medicine to reduce the amount of thyroid hormone your body makes, radioiodine therapy, surgery, or medicines to manage symptoms. This information is not intended to replace advice given to you by your health care provider. Make sure you discuss any questions you have with your health care provider. Document Revised: 03/22/2017 Document Reviewed: 03/20/2017 Elsevier Patient Education  2020 [employer].

## 2019-05-12 ENCOUNTER — Other Ambulatory Visit: Payer: Self-pay | Admitting: Internal Medicine

## 2019-05-12 ENCOUNTER — Ambulatory Visit: Payer: 59 | Admitting: Adult Health Nurse Practitioner

## 2019-05-12 ENCOUNTER — Other Ambulatory Visit: Payer: Self-pay

## 2019-05-12 ENCOUNTER — Encounter: Payer: Self-pay | Admitting: Internal Medicine

## 2019-05-12 VITALS — BP 128/70 | HR 81 | Temp 98.2°F | Ht 66.0 in | Wt 140.2 lb

## 2019-05-12 DIAGNOSIS — R000 Tachycardia, unspecified: Secondary | ICD-10-CM | POA: Diagnosis not present

## 2019-05-12 DIAGNOSIS — E559 Vitamin D deficiency, unspecified: Secondary | ICD-10-CM

## 2019-05-12 DIAGNOSIS — R42 Dizziness and giddiness: Secondary | ICD-10-CM

## 2019-05-12 DIAGNOSIS — G4709 Other insomnia: Secondary | ICD-10-CM | POA: Diagnosis not present

## 2019-05-12 DIAGNOSIS — D649 Anemia, unspecified: Secondary | ICD-10-CM

## 2019-05-12 DIAGNOSIS — F411 Generalized anxiety disorder: Secondary | ICD-10-CM | POA: Insufficient documentation

## 2019-05-12 MED ORDER — VITAMIN D (ERGOCALCIFEROL) 1.25 MG (50000 UNIT) PO CAPS: 50000.0000 [IU] | ORAL_CAPSULE | ORAL | 3 refills | Status: AC

## 2019-05-12 MED ORDER — PROPRANOLOL HCL ER 80 MG PO CP24: 80.0000 mg | ORAL_CAPSULE | Freq: Every day | ORAL | 3 refills | Status: DC

## 2019-05-12 NOTE — Patient Instructions (Signed)
° ° ° °  If you have lab work done today you will be contacted with your lab results within the next 2 weeks.  If you have not heard from us then please contact us. The fastest way to get your results is to register for My Chart. ° ° °IF you received an x-ray today, you will receive an invoice from [facility]. Please contact [facility] at [phone] with questions or concerns regarding your invoice.  ° °IF you received labwork today, you will receive an invoice from [facility]. Please contact [facility] at [phone] with questions or concerns regarding your invoice.  ° °Our billing staff will not be able to assist you with questions regarding bills from these companies. ° °You will be contacted with the lab results as soon as they are available. The fastest way to get your results is to activate your My Chart account. Instructions are located on the last page of this paperwork. If you have not heard from us regarding the results in 2 weeks, please contact this office. °  ° ° ° °

## 2019-05-12 NOTE — Progress Notes (Signed)
Patient referred by Brandy Penna, MD for tachycardia  Subjective:   Brandy Hansen, female    DOB: 04/18/1986, 34 y.o.   MRN: [id number]   Chief Complaint  Patient presents with  . Tachycardia     HPI  34 y.o. Caucasian female with thyrotoxicosis and goiter, tachycardia.   Patient works at [employer]. Stays busy with work and taking care of two children. She has been experiencing fast heart rate for a long time. More recently, she has been feeling fatigue, diarrhea, intolerance to heat etc. She has dyspnea when she lays at night. She feels short of breath sometimes when she is working with her mask on.   It appears that this referral was made prior to the subsequent diagnosis of thyrotoxicosis and goiter. She will be treated with methimazole by endocrinologist Dr. Cruzita Lederer. She was also started on propranolol CR 80 mg daily for her tachycardia.    Past Medical History:  Diagnosis Date  . Anemia   . Anxiety   . Arthritis   . Bipolar 1 disorder ([place])   . Chronic kidney disease    ? problems as child, "stretched" fine since  . Depression   . Gestational diabetes    Diet Controlled   . HPV (human papilloma virus) anogenital infection   . Hypertension   . Mental disorder   . Ovarian cyst   . PONV (postoperative nausea and vomiting)   . SVD (spontaneous vaginal delivery) 07/24/2018     Past Surgical History:  Procedure Laterality Date  . CHONDROPLASTY Right 10/27/2014   Procedure: CHONDROPLASTY;  Surgeon: Dorna Leitz, MD;  Location: [facility];  Service: Orthopedics;  Laterality: Right;  . DILATION AND EVACUATION N/A 08/20/2016   Procedure: DILATATION AND EVACUATION;  Surgeon: Sanjuana Kava, MD;  Location: [facility];  Service: Gynecology;  Laterality: N/A;  . KNEE ARTHROSCOPY Right 10/27/2014   Procedure: ARTHROSCOPY KNEE with chondroplasty of medial femeral condyle and femoral patella joint;  Surgeon: Dorna Leitz, MD;  Location: [facility];   Service: Orthopedics;  Laterality: Right;  . LAPAROSCOPY  06/24/2011   Procedure: LAPAROSCOPY OPERATIVE;  Surgeon: Guss Bunde, MD;  Location: [facility];  Service: Gynecology;  Laterality: Right;  operative laparoscopy and right salpingectomy with removal of ectopic pregnancy  . WRIST SURGERY       Social History   Tobacco Use  Smoking Status Former Smoker  . Types: Cigarettes  . Quit date: 11/23/2017  . Years since quitting: 1.4  Smokeless Tobacco Never Used    Social History   Substance and Sexual Activity  Alcohol Use Not Currently   Comment: rarely     Family History  Problem Relation Age of Onset  . Diabetes Mother   . Cancer Mother   . Hypertension Father   . Cancer Father   . Hyperlipidemia Father   . Cancer Maternal Grandmother   . Hyperlipidemia Maternal Grandmother   . Cancer Maternal Grandfather   . Cancer Paternal Grandmother   . Cancer Paternal Grandfather   . Stroke Paternal Grandfather   . Anesthesia problems Neg Hx      Current Outpatient Medications on File Prior to Visit  Medication Sig Dispense Refill  . acetaminophen (TYLENOL) 325 MG tablet Take 2 tablets (650 mg total) by mouth every 4 (four) hours as needed (for pain scale < 4). 30 tablet 0  . clonazePAM (KLONOPIN) 0.5 MG tablet Take 1 tablet (0.5 mg total) by mouth 2 (two) times daily  as needed for anxiety. 20 tablet 1  . propranolol ER (INDERAL LA) 80 MG 24 hr capsule Take 1 capsule (80 mg total) by mouth at bedtime. 90 capsule 3  . QUEtiapine (SEROQUEL) 100 MG tablet 1-2 tabs at night 60 tablet 0  . Vitamin D, Ergocalciferol, (DRISDOL) 1.25 MG (50000 UNIT) CAPS capsule Take 1 capsule (50,000 Units total) by mouth every 7 (seven) days. (Patient not taking: Reported on 05/13/2019) 5 capsule 3   No current facility-administered medications on file prior to visit.    Cardiovascular and other pertinent studies:   EKG 05/13/2019: Sinus tachycardia 102 bpm.  RSR(V1) -nondiagnostic.      Recent labs: 04/29/2019: Glucose 86, BUN/Cr 11/0.39. EGFR normal. Na/K 140/4.5. Rest of the CMP normal H/H 11.7/34.8. MCV 77. Platelets 307 TSH <0.005 very low. T4 >24.9 very high. Thyroperoxidase antibody 355 very high.     Review of Systems  Constitution: Positive for malaise/fatigue.  Cardiovascular: Negative for chest pain, dyspnea on exertion, leg swelling, palpitations and syncope.       Tachycardia         Vitals:   05/13/19 1008  BP: (!) 139/92  Pulse: (!) 105  Temp: 98.4 F (36.9 C)  SpO2: 98%     Body mass index is 22.5 kg/m. Filed Weights   05/13/19 1008  Weight: 139 lb 6.4 oz (63.2 kg)     Objective:   Physical Exam  Constitutional: She appears well-developed and well-nourished.  Neck: No JVD present. Thyromegaly present.  Cardiovascular: Normal heart sounds and intact distal pulses. Tachycardia present.  No murmur heard. Pulmonary/Chest: Effort normal and breath sounds normal. She has no wheezes. She has no rales.  Musculoskeletal:        General: No edema.  Nursing note and vitals reviewed.       Assessment & Recommendations:   34 y.o. Caucasian female with thyrotoxicosis and goiter, tachycardia.  Symptoms of tachycardia and mild orthopnea are most likely related to thyrotoxicosis and possibly compression effects from goiter. Continue ongoing treatment with propranolol, methimazole will be added soon. No further cardiac workup necessary at this time. Should her symptoms persist in spite of adequate treatment for thyrotoxicosis and goiter, I will see her back for any cardiac workup.   Thank you for referring the patient to [place]. Please feel free to contact with any questions.  Nigel Mormon, MD [facility] Pager: [phone] Office: [phone]

## 2019-05-12 NOTE — Addendum Note (Signed)
Addended by: Argentina Ponder on: 05/12/2019 03:27 PM   Modules accepted: Orders

## 2019-05-13 ENCOUNTER — Ambulatory Visit (INDEPENDENT_AMBULATORY_CARE_PROVIDER_SITE_OTHER): Payer: 59 | Admitting: Cardiology

## 2019-05-13 ENCOUNTER — Encounter: Payer: Self-pay | Admitting: Cardiology

## 2019-05-13 VITALS — BP 139/92 | HR 105 | Temp 98.4°F | Ht 66.0 in | Wt 139.4 lb

## 2019-05-13 DIAGNOSIS — E040 Nontoxic diffuse goiter: Secondary | ICD-10-CM

## 2019-05-13 DIAGNOSIS — R000 Tachycardia, unspecified: Secondary | ICD-10-CM

## 2019-05-14 ENCOUNTER — Encounter: Payer: Self-pay | Admitting: Internal Medicine

## 2019-05-14 LAB — THYROID STIMULATING IMMUNOGLOBULIN: TSI: 145 % baseline — ABNORMAL HIGH (ref <140)

## 2019-05-14 MED ORDER — METHIMAZOLE 10 MG PO TABS: 10.0000 mg | ORAL_TABLET | Freq: Two times a day (BID) | ORAL | 3 refills | Status: DC

## 2019-06-09 ENCOUNTER — Other Ambulatory Visit: Payer: Self-pay

## 2019-06-09 ENCOUNTER — Other Ambulatory Visit (INDEPENDENT_AMBULATORY_CARE_PROVIDER_SITE_OTHER): Payer: 59

## 2019-06-09 ENCOUNTER — Other Ambulatory Visit: Payer: Self-pay | Admitting: Internal Medicine

## 2019-06-09 DIAGNOSIS — E0590 Thyrotoxicosis, unspecified without thyrotoxic crisis or storm: Secondary | ICD-10-CM

## 2019-06-09 LAB — T3, FREE: T3, Free: 5.2 pg/mL — ABNORMAL HIGH (ref 2.3–4.2)

## 2019-06-09 LAB — T4, FREE: Free T4: 1.6 ng/dL (ref 0.60–1.60)

## 2019-06-09 LAB — TSH: TSH: <0.01 u[IU]/mL — ABNORMAL LOW (ref 0.35–4.50)

## 2019-06-16 ENCOUNTER — Encounter: Payer: Self-pay | Admitting: Internal Medicine

## 2019-06-17 ENCOUNTER — Telehealth: Payer: Self-pay | Admitting: Adult Health Nurse Practitioner

## 2019-06-17 NOTE — Telephone Encounter (Signed)
Copied from CRM [id number]. Topic: General - Call Back - No Documentation >> Jun 16, 2019  1:09 PM Randol Kern wrote: Best contact [phone] Patient is requesting a call back from nurse

## 2019-06-17 NOTE — Telephone Encounter (Signed)
LVM for pt to cb.

## 2019-06-18 ENCOUNTER — Ambulatory Visit: Payer: 59 | Admitting: Adult Health Nurse Practitioner

## 2019-06-18 ENCOUNTER — Encounter: Payer: Self-pay | Admitting: Adult Health Nurse Practitioner

## 2019-06-18 ENCOUNTER — Other Ambulatory Visit: Payer: Self-pay

## 2019-06-18 VITALS — BP 146/85 | HR 86 | Temp 98.7°F

## 2019-06-18 DIAGNOSIS — F411 Generalized anxiety disorder: Secondary | ICD-10-CM

## 2019-06-18 MED ORDER — CLOBETASOL PROPIONATE 0.05 % EX CREA: 1.0000 "application " | TOPICAL_CREAM | Freq: Two times a day (BID) | CUTANEOUS | 0 refills | Status: DC

## 2019-06-18 MED ORDER — LATUDA 20 MG PO TABS: 20.0000 mg | ORAL_TABLET | Freq: Every day | ORAL | 3 refills | Status: DC

## 2019-06-18 NOTE — Patient Instructions (Signed)
° ° ° °  If you have lab work done today you will be contacted with your lab results within the next 2 weeks.  If you have not heard from us then please contact us. The fastest way to get your results is to register for My Chart. ° ° °IF you received an x-ray today, you will receive an invoice from [facility]. Please contact [facility] at [phone] with questions or concerns regarding your invoice.  ° °IF you received labwork today, you will receive an invoice from [facility]. Please contact [facility] at [phone] with questions or concerns regarding your invoice.  ° °Our billing staff will not be able to assist you with questions regarding bills from these companies. ° °You will be contacted with the lab results as soon as they are available. The fastest way to get your results is to activate your My Chart account. Instructions are located on the last page of this paperwork. If you have not heard from us regarding the results in 2 weeks, please contact this office. °  ° ° ° °

## 2019-06-26 ENCOUNTER — Telehealth: Payer: Self-pay | Admitting: Adult Health Nurse Practitioner

## 2019-06-26 NOTE — Telephone Encounter (Signed)
Pt is having allergic reaction to latuda medication. Wanting alternative medication

## 2019-06-26 NOTE — Telephone Encounter (Signed)
Pt called regarding the Latuda medication the last pill she took 30 min after taking it she broke out in hives is wanting to get something else in place of that medication. Pt would like a call from nurse [phone] Please advise.

## 2019-06-29 ENCOUNTER — Telehealth: Payer: Self-pay

## 2019-06-29 NOTE — Telephone Encounter (Signed)
crm closed, no action needed

## 2019-07-01 NOTE — Telephone Encounter (Signed)
Pt checking on status of this message. Please advise 

## 2019-07-01 NOTE — Telephone Encounter (Signed)
Patient would like an alternative medication since she feels she has had an allergic reaction to Latuda  Please Advise.

## 2019-07-02 ENCOUNTER — Encounter: Payer: Self-pay | Admitting: Adult Health Nurse Practitioner

## 2019-07-02 MED ORDER — LAMOTRIGINE 25 MG PO TABS: ORAL_TABLET | ORAL | 0 refills | Status: DC

## 2019-07-02 NOTE — Progress Notes (Signed)
Reviewed medications with patient.  She had an urticarial reaction and discontinued the drug.  We discussed options.  I reviewed with a specialist.   She will stay on the Seroquel and we will add: Lamictal:  4m bid x 2 weeks, then 25mg  bid x 2 weeks, then 100mg  daily.  Informed patient of the potential for a Stevens-Johnson reaction which is why the titration is slow.  She is inline with this plan.   , NP

## 2019-07-10 ENCOUNTER — Encounter: Payer: Self-pay | Admitting: Adult Health Nurse Practitioner

## 2019-07-10 NOTE — Progress Notes (Signed)
Chief Complaint  Patient presents with  . Medication Problem    Pt stated that she feels like she is going throuigh major mood swings and she thinks it is coming from the Klonopin. Anx/Dep score 36    HPI   Patient presents with major mood swings and a lot of anger.  She has gone up on the Seroquel to 200 mg at night but it is making her tired.  She is noticing that the mood swings could be coming from the Klonopin although she is not using it regularly.  We discussed some other medications that could potentially help her degree of anxiety, anger, mood swings and settle the side effects of the hyperthyroidism currently.  She is not sleeping great but better than she was.  We discussed a trial of psychotropic drugs such as either Lamictal or Latuda.  Brandy Hansen would work quicker but would not be compatible with her Seroquel.  However, Lamictal would also be a good choice for the anxiety component but could cause a Stevens-Johnson's rash and does take some time to titrate up.  There is always Zyprexa but that also would leave her choosing between the Seroquel and Zyprexa.  At this point she has tried SSRIs before and had some what of a paradoxical effect which is not unheard given her history of bipolar and her current hyperthyroidism symptoms.  She has a lot of stress on her plate as usual.  She is the mother of 2 children 1 who is less than a year old.  Recently mother-in-law and her partner moved closer to help with the kids and this is something that has been good.  She is amenable to trying different medications to try to get this level of anxiety, anger, mood swings and generalized agitation under control.  Problem List    Problem List: 2021-01: Anxiety state 2021-01: Tachycardia 2021-01: HSIL on Pap smear of cervix 2021-01: Simple goiter 2020-04: Postpartum care following vaginal delivery 2020-04: Pregnant 2020-04: SVD (spontaneous vaginal delivery) 2018-04: Placenta,  retained 2014-08: Bipolar disorder in full remission (HCC) 2013-03: History of ectopic pregnancy   Allergies   is allergic to codeine; naproxen; and sulfa antibiotics.  Medications    Current Outpatient Medications:  .  methimazole (TAPAZOLE) 10 MG tablet, Take 1 tablet (10 mg total) by mouth 2 (two) times daily. With meals., Disp: 120 tablet, Rfl: 3 .  propranolol ER (INDERAL LA) 80 MG 24 hr capsule, Take 1 capsule (80 mg total) by mouth at bedtime., Disp: 90 capsule, Rfl: 3 .  QUEtiapine (SEROQUEL) 100 MG tablet, 1-2 tabs at night, Disp: 60 tablet, Rfl: 0 .  clobetasol cream (TEMOVATE) 0.05 %, Apply 1 application topically 2 (two) times daily., Disp: 30 g, Rfl: 0 .  clonazePAM (KLONOPIN) 0.5 MG tablet, Take 1 tablet (0.5 mg total) by mouth 2 (two) times daily as needed for anxiety., Disp: 20 tablet, Rfl: 1 .  lamoTRIgine (LAMICTAL) 25 MG tablet, 25mg  once daily x 2 weeks, Then 25mg  twice daily x 2 weeks, Disp: 42 tablet, Rfl: 0 .  lurasidone (LATUDA) 20 MG TABS tablet, Take 1 tablet (20 mg total) by mouth daily., Disp: 30 tablet, Rfl: 3   Review of Systems    Constitutional: Negative for activity change, appetite change, chills and fever.  HENT: Negative for congestion, nosebleeds, trouble swallowing and voice change.   Respiratory: Negative for cough, shortness of breath and wheezing.   Cardiac:  Negative for chest pain, pressure, syncope  Gastrointestinal: Negative for diarrhea, nausea  and vomiting.  Genitourinary: Negative for difficulty urinating, dysuria, flank pain and hematuria.  Musculoskeletal: Negative for back pain, joint swelling and neck pain.  Neurological: Negative for dizziness, speech difficulty, light-headedness and numbness.  Psychological ROS: positive for - anxiety, concentration difficulties, irritability and mood swings negative for - behavioral disorder, obsessive thoughts or suicidal ideation  See HPI. All other review of systems negative.     Physical  Exam:    temporal temperature is 98.7 F (37.1 C). Her blood pressure is 146/85 (abnormal) and her pulse is 86. Her oxygen saturation is 100%.   Physical Examination: General appearance - alert, well appearing, and in no distress and oriented to person, place, and time Mental status - normal mood, behavior, speech, dress, motor activity, and thought processes Eyes - PERRL. Extraocular movements intact.  No nystagmus.  Neck - supple, no significant adenopathy, carotids upstroke normal bilaterally, no bruits, thyroid exam: thyroid is normal in size without nodules or tenderness Chest - clear to auscultation, no wheezes, rales or rhonchi, symmetric air entry  Heart - normal rate, regular rhythm, normal S1, S2, no murmurs, rubs, clicks or gallops Extremities - dependent LE edema without clubbing or cyanosis Skin - normal coloration and turgor, no rashes, no suspicious skin lesions noted  No hyperpigmentation of skin.  No current hematomas noted   Lab /Imaging Review    GAD 7 : Generalized Anxiety Score 06/18/2019 05/12/2019 04/29/2019  Nervous, Anxious, on Edge 3 0 3  Control/stop worrying 3 0 1  Worry too much - different things 3 0 3  Trouble relaxing 3 0 3  Restless 3 - 3  Easily annoyed or irritable 3 0 1  Afraid - awful might happen 0 0 0  Total GAD 7 Score 18 - 14  Anxiety Difficulty Not difficult at all Not difficult at all -    [facility] SCORE ONLY 06/18/2019 05/12/2019 04/29/2019  Score 18 0 1     Assessment & Plan:  Brandy Hansen is a 34 y.o. female    No diagnosis found. No orders of the defined types were placed in this encounter.  Meds ordered this encounter  Medications  . lurasidone (LATUDA) 20 MG TABS tablet    Sig: Take 1 tablet (20 mg total) by mouth daily.    Dispense:  30 tablet    Refill:  3  . clobetasol cream (TEMOVATE) 0.05 %    Sig: Apply 1 application topically 2 (two) times daily.    Dispense:  30 g    Refill:  0    Will try Latuda and wean off  Seroquel.  Detailed instructions given.  Patient to f/u in 1 month.  Glyn Ade, NP

## 2019-07-12 ENCOUNTER — Encounter: Payer: Self-pay | Admitting: Internal Medicine

## 2019-07-14 ENCOUNTER — Other Ambulatory Visit (INDEPENDENT_AMBULATORY_CARE_PROVIDER_SITE_OTHER): Payer: 59

## 2019-07-14 ENCOUNTER — Other Ambulatory Visit: Payer: Self-pay

## 2019-07-14 ENCOUNTER — Other Ambulatory Visit: Payer: 59

## 2019-07-14 DIAGNOSIS — E0590 Thyrotoxicosis, unspecified without thyrotoxic crisis or storm: Secondary | ICD-10-CM

## 2019-07-14 LAB — T3, FREE: T3, Free: 3.9 pg/mL (ref 2.3–4.2)

## 2019-07-14 LAB — T4, FREE: Free T4: 0.92 ng/dL (ref 0.60–1.60)

## 2019-07-14 LAB — TSH: TSH: <0.01 u[IU]/mL — ABNORMAL LOW (ref 0.35–4.50)

## 2019-07-15 ENCOUNTER — Other Ambulatory Visit: Payer: Self-pay | Admitting: Internal Medicine

## 2019-07-15 DIAGNOSIS — E0590 Thyrotoxicosis, unspecified without thyrotoxic crisis or storm: Secondary | ICD-10-CM

## 2019-07-15 MED ORDER — PROPYLTHIOURACIL 50 MG PO TABS: 50.0000 mg | ORAL_TABLET | Freq: Three times a day (TID) | ORAL | 3 refills | Status: DC

## 2019-08-05 ENCOUNTER — Encounter: Payer: Self-pay | Admitting: Internal Medicine

## 2019-08-11 ENCOUNTER — Ambulatory Visit (INDEPENDENT_AMBULATORY_CARE_PROVIDER_SITE_OTHER): Payer: 59 | Admitting: Internal Medicine

## 2019-08-11 DIAGNOSIS — Z5329 Procedure and treatment not carried out because of patient's decision for other reasons: Secondary | ICD-10-CM

## 2019-08-11 NOTE — Progress Notes (Signed)
Patient ID: Brandy Hansen, female   DOB: [birth year], 34 y.o.   MRN: [id number]   NO SHOW - she forgot about the appt.   HPI  Brandy Hansen is a 34 y.o.-year-old female, referred by Arlan Organ, MD, for evaluation and management of thyrotoxicosis and goiter.  Last visit 3 months ago.  Reviewed history: Patient describes that for approximately a year (2020), she has been feeling more hyperactive, lost almost 30 lbs, has heat intolerance, tremors, itching, SOB, palpitations.  These symptoms worsened fall 2020.  She finally presented to see her PCP on 04/29/2019 and thyroid tests showed thyrotoxicosis.  She was immediately referred to endocrinology.  On 05/11/2019, we diagnosed Graves' disease based on elevated TSI antibodies and started her on methimazole 10 mg bid.  However, she developed a rash with methimazole and we had to switch to PTU 50 mg tid in 06/2019.  I reviewed her TFTs: Lab Results  Component Value Date   TSH <0.01 (L) 07/14/2019   TSH <0.01 (L) 06/09/2019   TSH <0.01 Repeated and verified X2. (L) 05/11/2019   TSH <0.005 (L) 04/29/2019   FREET4 0.92 07/14/2019   FREET4 1.60 06/09/2019   FREET4 3.83 (H) 05/11/2019   T3FREE 3.9 07/14/2019   T3FREE 5.2 (H) 06/09/2019   T3FREE 13.0 (H) 05/11/2019   Component     Latest Ref Rng & Units 04/29/2019  TSH     0.450 - 4.500 uIU/mL <0.005 (L)  Thyroxine (T4)     4.5 - 12.0 ug/dL >02.7 (HH)  T3 Uptake Ratio     24 - 39 % 56 (H)  Free Thyroxine Index     1.2 - 4.9 >13.9 (H)   Antithyroid antibodies were elevated: Lab Results  Component Value Date   TSI 145 (H) 05/11/2019  04/29/2019: ATA antibodies <1, TPO antibodies 355 (0-34)  Thyroid ultrasound (10/07/2012): Mildly, enlarged, homogeneous thyroid gland  Pt denies: - feeling nodules in neck - hoarseness - dysphagia - choking - SOB with lying down At last visit she did complain of dysphagia and occasional choking with saliva and also shortness of breath when  laying down.  At last visit she also complained of: Fatigue, insomnia (chronic-on Seroquel), heat intolerance, tremors, anxiety (has a history of bipolar disorder), palpitations, hyper defecation, weight loss, easy bruising.  At this visit: *  Pt does not have a FH of thyroid ds. No FH of thyroid cancer. No h/o radiation tx to head or neck.  No seaweed or kelp, no recent contrast studies. No steroid use. No herbal supplements. No Biotin use.   Mother with DM1.  She also has a history of anemia with Hb of 6.6!  Last pregnancy 07/2018. She has 2 children.  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  ROS:   Past Medical History:  Diagnosis Date  . Anemia   . Anxiety   . Arthritis   . Bipolar 1 disorder (HCC)   . Chronic kidney disease    ? problems as child, "stretched" fine since  . Depression   . Gestational diabetes    Diet Controlled   . HPV (human papilloma virus) anogenital infection   . Hypertension   . Mental disorder   . Ovarian cyst   . PONV (postoperative nausea and vomiting)   . SVD (spontaneous vaginal delivery) 07/24/2018   Past Surgical History:  Procedure Laterality Date  . CHONDROPLASTY Right 10/27/2014   Procedure: CHONDROPLASTY;  Surgeon:  Dorna Leitz, MD;  Location: [facility];  Service: Orthopedics;  Laterality: Right;  . DILATION AND EVACUATION N/A 08/20/2016   Procedure: DILATATION AND EVACUATION;  Surgeon: Sanjuana Kava, MD;  Location: [facility];  Service: Gynecology;  Laterality: N/A;  . KNEE ARTHROSCOPY Right 10/27/2014   Procedure: ARTHROSCOPY KNEE with chondroplasty of medial femeral condyle and femoral patella joint;  Surgeon: Dorna Leitz, MD;  Location: [facility];  Service: Orthopedics;  Laterality: Right;  . LAPAROSCOPY  06/24/2011   Procedure: LAPAROSCOPY OPERATIVE;  Surgeon: Guss Bunde, MD;  Location: [facility];  Service: Gynecology;  Laterality: Right;  operative laparoscopy  and right salpingectomy with removal of ectopic pregnancy  . WRIST SURGERY     Social History   Socioeconomic History  . Marital status: Married    Spouse name: Not on file  . Number of children: 2: 9 years and 9 months in 04/2019  . Years of education: Not on file  . Highest education level: Not on file  Occupational History  .  Varies diet [  Tobacco Use  . Smoking status: Former Smoker, now vapes    Types: Cigarettes    Quit date: 11/23/2017    Years since quitting: 1.4  . Smokeless tobacco: Never Used  Substance and Sexual Activity  . Alcohol use:     Comment: rarely, beer  . Drug use: No   Social Determinants of Health   Financial Resource Strain:   . Difficulty of Paying Living Expenses: Not on file  Food Insecurity:   . Worried About Charity fundraiser in the Last Year: Not on file  . Ran Out of Food in the Last Year: Not on file  Transportation Needs:   . Lack of Transportation (Medical): Not on file  . Lack of Transportation (Non-Medical): Not on file  Physical Activity:   . Days of Exercise per Week: Not on file  . Minutes of Exercise per Session: Not on file  Stress:   . Feeling of Stress : Not on file  Social Connections:   . Frequency of Communication with Friends and Family: Not on file  . Frequency of Social Gatherings with Friends and Family: Not on file  . Attends Religious Services: Not on file  . Active Member of Clubs or Organizations: Not on file  . Attends Archivist Meetings: Not on file  . Marital Status: Not on file  Intimate Partner Violence:   . Fear of Current or Ex-Partner: Not on file  . Emotionally Abused: Not on file  . Physically Abused: Not on file  . Sexually Abused: Not on file   Current Outpatient Medications on File Prior to Visit  Medication Sig Dispense Refill  . clobetasol cream (TEMOVATE) 6.38 % Apply 1 application topically 2 (two) times daily. 30 g 0  . clonazePAM (KLONOPIN) 0.5 MG tablet Take 1 tablet (0.5 mg  total) by mouth 2 (two) times daily as needed for anxiety. 20 tablet 1  . lamoTRIgine (LAMICTAL) 25 MG tablet 25mg  once daily x 2 weeks, Then 25mg  twice daily x 2 weeks 42 tablet 0  . lurasidone (LATUDA) 20 MG TABS tablet Take 1 tablet (20 mg total) by mouth daily. 30 tablet 3  . propranolol ER (INDERAL LA) 80 MG 24 hr capsule Take 1 capsule (80 mg total) by mouth at bedtime. 90 capsule 3  . propylthiouracil (PTU) 50 MG tablet Take 1 tablet (50 mg total) by mouth 3 (three) times daily. [place]  tablet 3  . QUEtiapine (SEROQUEL) 100 MG tablet 1-2 tabs at night 60 tablet 0   No current facility-administered medications on file prior to visit.   Allergies  Allergen Reactions  . Codeine Itching  . Naproxen Nausea And Vomiting and Rash  . Sulfa Antibiotics Rash   Family History  Problem Relation Age of Onset  . Diabetes Mother   . Cancer Mother   . Hypertension Father   . Cancer Father   . Hyperlipidemia Father   . Cancer Maternal Grandmother   . Hyperlipidemia Maternal Grandmother   . Cancer Maternal Grandfather   . Cancer Paternal Grandmother   . Cancer Paternal Grandfather   . Stroke Paternal Grandfather   . Anesthesia problems Neg Hx    PE: There were no vitals taken for this visit. Wt Readings from Last 3 Encounters:  05/13/19 139 lb 6.4 oz (63.2 kg)  05/12/19 140 lb 3.2 oz (63.6 kg)  05/11/19 139 lb (63 kg)    ASSESSMENT: 1.  Graves' disease  2. Goiter  PLAN:  1. Patient with an approximately 1 year history of thyrotoxic symptoms: 30 pound weight loss, heat intolerance, hyper defecation, tremors, palpitations, anxiety, diagnosed as Graves' disease in 04/2019.  At that time, her Graves' antibodies are elevated so we did not proceed with a thyroid uptake and scan.  We discussed about possible modalities of treatment for Graves' disease and we decided to start methimazole at 10 mg twice a day.  However, she could not tolerate methimazole due to rash.  We switched to PTU in  06/2019: 50 mg 3 times a day.  -On propranolol CR 80 mg daily for tachycardia -No signs of Graves' ophthalmopathy: No double vision, blurry vision, chemosis but she did have right eye pain in the past (resolved) - RTC in 4 months  2. Goiter -Nonnodular goiter per review of her thyroid ultrasound report from 2014 -She has some neck compression symptoms, but most likely due to inflammation due to Graves' disease -We may need to proceed with RAI treatment, which can also shrink the thyroid, depending on how she responds to thionamides treatment.   Carlus Pavlov, MD PhD [facility]

## 2019-08-16 ENCOUNTER — Encounter: Payer: Self-pay | Admitting: Internal Medicine

## 2019-08-17 ENCOUNTER — Encounter: Payer: Self-pay | Admitting: Internal Medicine

## 2019-08-18 ENCOUNTER — Other Ambulatory Visit: Payer: Self-pay

## 2019-08-18 ENCOUNTER — Encounter: Payer: Self-pay | Admitting: Internal Medicine

## 2019-08-18 ENCOUNTER — Other Ambulatory Visit (INDEPENDENT_AMBULATORY_CARE_PROVIDER_SITE_OTHER): Payer: 59

## 2019-08-18 DIAGNOSIS — E0590 Thyrotoxicosis, unspecified without thyrotoxic crisis or storm: Secondary | ICD-10-CM | POA: Diagnosis not present

## 2019-08-18 LAB — T3, FREE: T3, Free: 4.7 pg/mL — ABNORMAL HIGH (ref 2.3–4.2)

## 2019-08-18 LAB — TSH: TSH: <0.01 u[IU]/mL — ABNORMAL LOW (ref 0.35–4.50)

## 2019-08-18 LAB — T4, FREE: Free T4: 2.01 ng/dL — ABNORMAL HIGH (ref 0.60–1.60)

## 2019-08-19 ENCOUNTER — Encounter: Payer: Self-pay | Admitting: Internal Medicine

## 2019-08-20 ENCOUNTER — Other Ambulatory Visit: Payer: Self-pay | Admitting: Internal Medicine

## 2019-08-20 DIAGNOSIS — E0500 Thyrotoxicosis with diffuse goiter without thyrotoxic crisis or storm: Secondary | ICD-10-CM

## 2019-08-31 ENCOUNTER — Encounter: Payer: Self-pay | Admitting: Adult Health Nurse Practitioner

## 2019-08-31 MED ORDER — CLONAZEPAM 0.5 MG PO TABS: 0.5000 mg | ORAL_TABLET | Freq: Two times a day (BID) | ORAL | 3 refills | Status: DC | PRN

## 2019-08-31 NOTE — Progress Notes (Signed)
Patient called.  She is having increased anxiety since having some marital problems with her husband.  She requested a refill of the clonazepam.  Given that I am leaving clinic, I put in her 25-month refill and she can follow-up with you whom she would like to see.  Reviewed the database.No concern for diversion.

## 2019-09-16 ENCOUNTER — Ambulatory Visit: Payer: Self-pay | Admitting: Surgery

## 2019-09-22 DIAGNOSIS — E890 Postprocedural hypothyroidism: Secondary | ICD-10-CM

## 2019-09-22 HISTORY — DX: Postprocedural hypothyroidism: E89.0

## 2019-09-27 ENCOUNTER — Encounter: Payer: Self-pay | Admitting: Surgery

## 2019-09-27 DIAGNOSIS — E0500 Thyrotoxicosis with diffuse goiter without thyrotoxic crisis or storm: Secondary | ICD-10-CM | POA: Diagnosis present

## 2019-09-27 NOTE — H&P (Signed)
General Surgery [facility]  Brandy Hansen DOB: [birth year] Married / Language: English / Race: White Female   History of Present Illness   The patient is a 34 year old female who presents with hyperthyroidism.  CHIEF COMPLAINT: Graves' disease  Patient is referred by Dr. Philemon Kingdom for surgical evaluation and management of hyperthyroidism due to Graves' disease. Patient was diagnosed with an enlarged thyroid gland approximately 7 years ago. She underwent an ultrasound in June 2014. This showed mild enlargement of the thyroid gland but no significant nodules were identified. Approximately 4 months ago the patient developed signs and symptoms of hyperthyroidism. TSH level was markedly suppressed. The patient has had multiple complaints but most prominently notes fatigue as well as palpitations. She did have a significant tremor but that has improved while on antibiotic thyroid medication. 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 She is taking Zyrtec to help with the allergic reaction. Patient has discussed radioactive iodine treatment with her endocrinologist. Due to having small children at home and they desire to have additional children in the near future, the patient would like to consider surgical resection for management of hyperthyroidism. Patient is had no prior head or neck surgery. There is no family history of thyroid disease or endocrine neoplasms. Patient presents today to discuss thyroid surgery for management of hyperthyroidism.   Past Surgical History Knee Surgery  Right.  Diagnostic Studies History Colonoscopy  never Mammogram  never Pap Smear  1-5 years ago  Allergies  Codeine Phosphate *ANALGESICS - OPIOID*  Sulfa Antibiotics  Naproxen & Capsaicin-Menthol *DERMATOLOGICALS*  Synthroid *THYROID AGENTS*   Medication History  clonazePAM (0.5MG  Tablet, Oral) Active. Propylthiouracil  (50MG  Tablet, Oral) Active. Propranolol HCl ER (80MG  Capsule ER 24HR, Oral) Active. Vitamin D (Ergocalciferol) (1.25 MG(50000 UT) Capsule, Oral) Active. Medications Reconciled  Social History  Alcohol use  Occasional alcohol use. Caffeine use  Coffee, Tea. Illicit drug use  Remotely quit drug use. Tobacco use  Current some day smoker.  Family History  Breast Cancer  Mother. Depression  Father. Diabetes Mellitus  Mother. Hypertension  Father. Prostate Cancer  Father.  Pregnancy / Birth History Age at menarche  32 years. Gravida  5 Length (months) of breastfeeding  3-6 Maternal age  69-30 Para  2 Regular periods   Other Problems  Anxiety Disorder  Back Pain  Bladder Problems  Depression  High blood pressure  Thyroid Disease    Review of Systems General Present- Fatigue. Not Present- Appetite Loss, Chills, Fever, Night Sweats, Weight Gain and Weight Loss. Skin Present- Hives and Rash. Not Present- Change in Wart/Mole, Dryness, Jaundice, New Lesions, Non-Healing Wounds and Ulcer. HEENT Present- Wears glasses/contact lenses. Not Present- Earache, Hearing Loss, Hoarseness, Nose Bleed, Oral Ulcers, Ringing in the Ears, Seasonal Allergies, Sinus Pain, Sore Throat, Visual Disturbances and Yellow Eyes. Respiratory Not Present- Bloody sputum, Chronic Cough, Difficulty Breathing, Snoring and Wheezing. Cardiovascular Present- Swelling of Extremities. Not Present- Chest Pain, Difficulty Breathing Lying Down, Leg Cramps, Palpitations, Rapid Heart Rate and Shortness of Breath. Gastrointestinal Present- Change in Bowel Habits. Not Present- Abdominal Pain, Bloating, Bloody Stool, Chronic diarrhea, Constipation, Difficulty Swallowing, Excessive gas, Gets full quickly at meals, Hemorrhoids, Indigestion, Nausea, Rectal Pain and Vomiting. Female Genitourinary Not Present- Frequency, Nocturia, Painful Urination, Pelvic Pain and Urgency. Neurological Present- Decreased  Memory, Headaches and Weakness. Not Present- Fainting, Numbness, Seizures, Tingling, Tremor and Trouble walking. Psychiatric Present- Anxiety and Bipolar. Not Present- Change in Sleep Pattern, Depression, Fearful and Frequent  crying. Endocrine Present- Heat Intolerance. Not Present- Cold Intolerance, Excessive Hunger, Hair Changes, Hot flashes and New Diabetes. Hematology Not Present- Blood Thinners, Easy Bruising, Excessive bleeding, Gland problems, HIV and Persistent Infections.  Vitals Weight: 151.25 lb Height: 67in Body Surface Area: 1.8 m Body Mass Index: 23.69 kg/m  Temp.: 98.70F  Pulse: 99 (Regular)  BP: 124/80(Sitting, Left Arm, Standard)  Physical Exam  GENERAL APPEARANCE Development: normal Nutritional status: normal Gross deformities: none  SKIN Rash, lesions, ulcers: none Induration, erythema: none Nodules: none palpable  EYES Conjunctiva and lids: normal Pupils: equal and reactive Iris: normal bilaterally  EARS, NOSE, MOUTH, THROAT External ears: no lesion or deformity External nose: no lesion or deformity Hearing: grossly normal Due to Covid-19 pandemic, patient is wearing a mask.  NECK Symmetric: yes Trachea: midline Thyroid: Thyroid gland is symmetrically mildly enlarged, moderately firm, nontender, and without discrete or dominant mass. There is no associated lymphadenopathy.  CHEST Respiratory effort: normal Retraction or accessory muscle use: no Breath sounds: normal bilaterally Rales, rhonchi, wheeze: none  CARDIOVASCULAR Auscultation: regular rhythm, normal rate Murmurs: none Pulses: radial pulse 2+ palpable Lower extremity edema: none  MUSCULOSKELETAL Station and gait: normal Digits and nails: no clubbing or cyanosis Muscle strength: grossly normal all extremities Range of motion: grossly normal all extremities Deformity: none Mild tremor bilateral hands  LYMPHATIC Cervical: none palpable Supraclavicular: none  palpable  PSYCHIATRIC Oriented to person, place, and time: yes Mood and affect: normal for situation Judgment and insight: appropriate for situation    Assessment & Plan   GRAVES DISEASE (E05.00) ENLARGED THYROID (E04.9)  Patient is referred by her endocrinologist for surgical evaluation and recommendations regarding total thyroidectomy for management of hyperthyroidism due to Graves' disease.  Patient provided with a copy of "The Thyroid Book: Medical and Surgical Treatment of Thyroid Problems", published by [employer], 16 pages. Book reviewed and explained to patient during visit today.  The patient and I reviewed her records dating back to 2014. We discussed her current medical management of Graves' disease using PTU. We discussed her symptoms which are mainly chronic fatigue and palpitations. Tremor has improved. She denies any compressive symptoms. We discussed the options for management, being medical treatment with radioactive iodine ablation versus surgical treatment with total thyroidectomy. After consideration, the patient would like to proceed with total thyroidectomy. Today we discussed the procedure and I provided her with written literature on thyroid surgery to review at home. We discussed risk and benefits of the procedure including the risk of recurrent laryngeal nerve injury and injury to parathyroid glands. We discussed the size and location of the surgical incision. We discussed the hospital stay to be anticipated. We discussed the need for lifelong thyroid hormone replacement therapy. Patient understands and wishes to proceed with surgery in the near future.  The risks and benefits of the procedure have been discussed at length with the patient. The patient understands the proposed procedure, potential alternative treatments, and the course of recovery to be expected. All of the patient's questions have been answered at this time. The patient wishes to proceed  with surgery.  Darnell Level, MD [facility] Office: [phone]

## 2019-09-28 ENCOUNTER — Encounter (HOSPITAL_COMMUNITY): Payer: Self-pay

## 2019-09-28 NOTE — Progress Notes (Addendum)
PCP - Janne Lab , NP Cardiologist -   PPM/ICD -  Device Orders -  Rep Notified -   Chest x-ray - 09-29-19 epic EKG - 05-13-19 epic Stress Test -  ECHO -  Cardiac Cath -   Sleep Study -  CPAP -   Fasting Blood Sugar -  Checks Blood Sugar _____ times a day  Blood Thinner Instructions: Aspirin Instructions:  ERAS Protcol - PRE-SURGERY Ensure or G2-   COVID TEST- 6-8   Anesthesia review: cxr  Patient denies shortness of breath, fever, cough and chest pain at PAT appointment  none   All instructions explained to the patient, with a verbal understanding of the material. Patient agrees to go over the instructions while at home for a better understanding. Patient also instructed to self quarantine after being tested for COVID-19. The opportunity to ask questions was provided.

## 2019-09-28 NOTE — Patient Instructions (Addendum)
DUE TO COVID-19 ONLY ONE VISITOR IS ALLOWED TO COME WITH YOU AND STAY IN THE WAITING ROOM ONLY DURING PRE OP AND PROCEDURE DAY OF SURGERY. Two  VISITOR MAY VISIT WITH YOU AFTER SURGERY IN YOUR PRIVATE ROOM DURING VISITING HOURS ONLY!  10a-8-  YOU NEED TO HAVE A COVID 19 TEST ON__6-8-21_____ @  11:00_______, THIS TEST MUST BE DONE BEFORE SURGERY, COME  [address], [place] , [address].  [facility]) ONCE YOUR COVID TEST IS COMPLETED, PLEASE BEGIN THE QUARANTINE INSTRUCTIONS AS OUTLINED IN YOUR HANDOUT.                [address]  09/28/2019   Your procedure is scheduled on: 10-02-19   Report to [facility] Main  Entrance   Report to admitting at     0800  AM     Call this number if you have problems the morning of surgery [phone]    Remember: Do not eat food or drink liquids :After Midnight.   BRUSH YOUR TEETH MORNING OF SURGERY AND RINSE YOUR MOUTH OUT, NO CHEWING GUM CANDY OR MINTS.     Take these medicines the morning of surgery with A SIP OF WATER: none                                 You may not have any metal on your body including hair pins and              piercings  Do not wear jewelry, make-up, lotions, powders or perfumes, deodorant             Do not wear nail polish on your fingernails.  Do not shave  48 hours prior to surgery.         Do not bring valuables to the hospital. [facility] IS NOT             RESPONSIBLE   FOR VALUABLES.  Contacts, dentures or bridgework may not be worn into surgery.      Patients discharged the day of surgery will not be allowed to drive home. IF YOU ARE HAVING SURGERY AND GOING HOME THE SAME DAY, YOU MUST HAVE AN ADULT TO DRIVE YOU HOME AND BE WITH YOU FOR 24 HOURS. YOU MAY GO HOME BY TAXI OR UBER OR ORTHERWISE, BUT AN ADULT MUST ACCOMPANY YOU HOME AND STAY WITH YOU FOR 24 HOURS.  Name and phone number of your driver:  Special Instructions: N/A              Please read over the following  fact sheets you were given: _____________________________________________________________________             [facility] - Preparing for Surgery Before surgery, you can play an important role.  Because skin is not sterile, your skin needs to be as free of germs as possible.  You can reduce the number of germs on your skin by washing with CHG (chlorahexidine gluconate) soap before surgery.  CHG is an antiseptic cleaner which kills germs and bonds with the skin to continue killing germs even after washing. Please DO NOT use if you have an allergy to CHG or antibacterial soaps.  If your skin becomes reddened/irritated stop using the CHG and inform your nurse when you arrive at Short Stay. Do not shave (including legs and underarms) for at least 48 hours prior to the first CHG  shower.  You may shave your face/neck. Please follow these instructions carefully:  1.  Shower with CHG Soap the night before surgery and the  morning of Surgery.  2.  If you choose to wash your hair, wash your hair first as usual with your  normal  shampoo.  3.  After you shampoo, rinse your hair and body thoroughly to remove the  shampoo.                           4.  Use CHG as you would any other liquid soap.  You can apply chg directly  to the skin and wash                       Gently with a scrungie or clean washcloth.  5.  Apply the CHG Soap to your body ONLY FROM THE NECK DOWN.   Do not use on face/ open                           Wound or open sores. Avoid contact with eyes, ears mouth and genitals (private parts).                       Wash face,  Genitals (private parts) with your normal soap.             6.  Wash thoroughly, paying special attention to the area where your surgery  will be performed.  7.  Thoroughly rinse your body with warm water from the neck down.  8.  DO NOT shower/wash with your normal soap after using and rinsing off  the CHG Soap.                9.  Pat yourself dry with a clean towel.             10.  Wear clean pajamas.            11.  Place clean sheets on your bed the night of your first shower and do not  sleep with pets. Day of Surgery : Do not apply any lotions/deodorants the morning of surgery.  Please wear clean clothes to the hospital/surgery center.  FAILURE TO FOLLOW THESE INSTRUCTIONS MAY RESULT IN THE CANCELLATION OF YOUR SURGERY PATIENT SIGNATURE_________________________________  NURSE SIGNATURE__________________________________  ________________________________________________________________________

## 2019-09-29 ENCOUNTER — Other Ambulatory Visit (HOSPITAL_COMMUNITY)
Admission: RE | Admit: 2019-09-29 | Discharge: 2019-09-29 | Disposition: A | Discharge status: Home / Self Care | Payer: 59 | Source: Ambulatory Visit | Attending: Surgery | Admitting: Surgery

## 2019-09-29 ENCOUNTER — Encounter (HOSPITAL_COMMUNITY)
Admission: RE | Admit: 2019-09-29 | Discharge: 2019-09-29 | Disposition: A | Discharge status: Home / Self Care | Payer: 59 | Source: Ambulatory Visit | Attending: Surgery | Admitting: Surgery

## 2019-09-29 ENCOUNTER — Ambulatory Visit (HOSPITAL_COMMUNITY)
Admission: RE | Admit: 2019-09-29 | Discharge: 2019-09-29 | Disposition: A | Discharge status: Home / Self Care | Payer: 59 | Source: Ambulatory Visit | Attending: Anesthesiology | Admitting: Anesthesiology

## 2019-09-29 ENCOUNTER — Other Ambulatory Visit: Payer: Self-pay

## 2019-09-29 ENCOUNTER — Encounter (HOSPITAL_COMMUNITY): Payer: Self-pay

## 2019-09-29 DIAGNOSIS — Z01812 Encounter for preprocedural laboratory examination: Secondary | ICD-10-CM | POA: Insufficient documentation

## 2019-09-29 DIAGNOSIS — Z01818 Encounter for other preprocedural examination: Secondary | ICD-10-CM

## 2019-09-29 DIAGNOSIS — Z20822 Contact with and (suspected) exposure to covid-19: Secondary | ICD-10-CM | POA: Diagnosis not present

## 2019-09-29 HISTORY — DX: Tachycardia, unspecified: R00.0

## 2019-09-29 LAB — CBC
HCT: 37.9 % (ref 36.0–46.0)
Hemoglobin: 12.4 g/dL (ref 12.0–15.0)
MCH: 27.9 pg (ref 26.0–34.0)
MCHC: 32.7 g/dL (ref 30.0–36.0)
MCV: 85.2 fL (ref 80.0–100.0)
Platelets: 272 10*3/uL (ref 150–400)
RBC: 4.45 MIL/uL (ref 3.87–5.11)
RDW: 12.2 % (ref 11.5–15.5)
WBC: 6.4 10*3/uL (ref 4.0–10.5)
nRBC: 0 % (ref 0.0–0.2)

## 2019-09-29 LAB — BASIC METABOLIC PANEL
Anion gap: 7 (ref 5–15)
BUN: 15 mg/dL (ref 6–20)
CO2: 25 mmol/L (ref 22–32)
Calcium: 8.8 mg/dL — ABNORMAL LOW (ref 8.9–10.3)
Chloride: 106 mmol/L (ref 98–111)
Creatinine, Ser: 0.53 mg/dL (ref 0.44–1.00)
GFR calc Af Amer: >60 mL/min (ref >60)
GFR calc non Af Amer: >60 mL/min (ref >60)
Glucose, Bld: 118 mg/dL — ABNORMAL HIGH (ref 70–99)
Potassium: 4.3 mmol/L (ref 3.5–5.1)
Sodium: 138 mmol/L (ref 135–145)

## 2019-09-29 LAB — SARS CORONAVIRUS 2 (TAT 6-24 HRS): SARS Coronavirus 2: NEGATIVE

## 2019-09-30 NOTE — Progress Notes (Signed)
Surgery time now 0730am with arrival time of 0530am.  Called pt and LVMM in regards to time change and asked for call back.

## 2019-10-01 ENCOUNTER — Telehealth: Payer: Self-pay | Admitting: Adult Health Nurse Practitioner

## 2019-10-01 NOTE — Telephone Encounter (Signed)
LVMTCB for pt to sch an appt to f/u with pts PCP from a chest X-ray pt had from [facility].  Pt is also needing a TOC appt as well.

## 2019-10-01 NOTE — Anesthesia Preprocedure Evaluation (Addendum)
Anesthesia Evaluation  Patient identified by MRN, date of birth, ID band Patient awake    Reviewed: Allergy & Precautions, NPO status , Patient's Chart, lab work & pertinent test results  History of Anesthesia Complications (+) PONV  Airway Mallampati: I  TM Distance: >3 FB Neck ROM: Full    Dental no notable dental hx. (+) Partial Lower   Pulmonary neg pulmonary ROS, Current Smoker and Patient abstained from smoking.,    Pulmonary exam normal breath sounds clear to auscultation       Cardiovascular negative cardio ROS Normal cardiovascular exam Rhythm:Regular Rate:Normal     Neuro/Psych PSYCHIATRIC DISORDERS Anxiety Depression Bipolar Disorder negative neurological ROS     GI/Hepatic negative GI ROS, Neg liver ROS,   Endo/Other  Hyperthyroidism (Grave's disease on PTU and propranolol)   Renal/GU negative Renal ROS  negative genitourinary   Musculoskeletal  (+) Arthritis ,   Abdominal   Peds  Hematology negative hematology ROS (+)   Anesthesia Other Findings   Reproductive/Obstetrics                            Anesthesia Physical Anesthesia Plan  ASA: II  Anesthesia Plan: General   Post-op Pain Management:    Induction: Intravenous  PONV Risk Score and Plan: 3 and Midazolam, Dexamethasone, Ondansetron, Scopolamine patch - Pre-op and TIVA  Airway Management Planned: Oral ETT  Additional Equipment:   Intra-op Plan:   Post-operative Plan: Extubation in OR  Informed Consent: I have reviewed the patients History and Physical, chart, labs and discussed the procedure including the risks, benefits and alternatives for the proposed anesthesia with the patient or authorized representative who has indicated his/her understanding and acceptance.     Dental advisory given  Plan Discussed with: CRNA  Anesthesia Plan Comments:        Anesthesia Quick Evaluation

## 2019-10-02 ENCOUNTER — Encounter: Payer: Self-pay | Admitting: Registered Nurse

## 2019-10-02 ENCOUNTER — Encounter (HOSPITAL_COMMUNITY)
Admission: RE | Disposition: A | Discharge status: Home / Self Care | Payer: Self-pay | Source: Home / Self Care | Attending: Surgery

## 2019-10-02 ENCOUNTER — Ambulatory Visit (HOSPITAL_COMMUNITY)
Admission: RE | Admit: 2019-10-02 | Discharge: 2019-10-03 | Disposition: A | Discharge status: Home / Self Care | Payer: 59 | Attending: Surgery | Admitting: Surgery

## 2019-10-02 ENCOUNTER — Ambulatory Visit (HOSPITAL_COMMUNITY): Payer: 59 | Admitting: Anesthesiology

## 2019-10-02 ENCOUNTER — Other Ambulatory Visit: Payer: Self-pay | Admitting: Internal Medicine

## 2019-10-02 ENCOUNTER — Other Ambulatory Visit: Payer: Self-pay

## 2019-10-02 ENCOUNTER — Encounter (HOSPITAL_COMMUNITY): Payer: Self-pay | Admitting: Surgery

## 2019-10-02 DIAGNOSIS — F17200 Nicotine dependence, unspecified, uncomplicated: Secondary | ICD-10-CM | POA: Diagnosis not present

## 2019-10-02 DIAGNOSIS — E049 Nontoxic goiter, unspecified: Secondary | ICD-10-CM

## 2019-10-02 DIAGNOSIS — I10 Essential (primary) hypertension: Secondary | ICD-10-CM | POA: Insufficient documentation

## 2019-10-02 DIAGNOSIS — E0500 Thyrotoxicosis with diffuse goiter without thyrotoxic crisis or storm: Secondary | ICD-10-CM

## 2019-10-02 DIAGNOSIS — F419 Anxiety disorder, unspecified: Secondary | ICD-10-CM | POA: Diagnosis not present

## 2019-10-02 DIAGNOSIS — Z79899 Other long term (current) drug therapy: Secondary | ICD-10-CM | POA: Insufficient documentation

## 2019-10-02 HISTORY — PX: THYROIDECTOMY: SHX17

## 2019-10-02 LAB — PREGNANCY, URINE: Preg Test, Ur: NEGATIVE

## 2019-10-02 SURGERY — THYROIDECTOMY
Anesthesia: General

## 2019-10-02 MED ORDER — ORAL CARE MOUTH RINSE: 15.0000 mL | Freq: Once | OROMUCOSAL | Status: AC

## 2019-10-02 MED ORDER — ACETAMINOPHEN 650 MG RE SUPP: 650.0000 mg | Freq: Four times a day (QID) | RECTAL | Status: DC | PRN

## 2019-10-02 MED ORDER — ESMOLOL HCL 100 MG/10ML IV SOLN
INTRAVENOUS | Status: DC | PRN
Start: 2019-10-02 — End: 2019-10-02
  Administered 2019-10-02: 20 mg via INTRAVENOUS

## 2019-10-02 MED ORDER — FENTANYL CITRATE (PF) 250 MCG/5ML IJ SOLN
INTRAMUSCULAR | Status: DC | PRN
  Administered 2019-10-02: 100 ug via INTRAVENOUS
  Administered 2019-10-02: 50 ug via INTRAVENOUS
  Administered 2019-10-02 (×2): 100 ug via INTRAVENOUS

## 2019-10-02 MED ORDER — DEXAMETHASONE SODIUM PHOSPHATE 10 MG/ML IJ SOLN
INTRAMUSCULAR | Status: DC | PRN
  Administered 2019-10-02: 5 mg via INTRAVENOUS

## 2019-10-02 MED ORDER — SUGAMMADEX SODIUM 200 MG/2ML IV SOLN
INTRAVENOUS | Status: DC | PRN
  Administered 2019-10-02: 200 mg via INTRAVENOUS

## 2019-10-02 MED ORDER — ONDANSETRON HCL 4 MG/2ML IJ SOLN
INTRAMUSCULAR | Status: AC
  Filled 2019-10-02: qty 2

## 2019-10-02 MED ORDER — PROPOFOL 10 MG/ML IV BOLUS
INTRAVENOUS | Status: AC
  Filled 2019-10-02: qty 20

## 2019-10-02 MED ORDER — CHLORHEXIDINE GLUCONATE CLOTH 2 % EX PADS: 6.0000 | MEDICATED_PAD | Freq: Once | CUTANEOUS | Status: DC

## 2019-10-02 MED ORDER — PROPOFOL 500 MG/50ML IV EMUL
INTRAVENOUS | Status: DC | PRN
Start: 2019-10-02 — End: 2019-10-02
  Administered 2019-10-02: 150 ug/kg/min via INTRAVENOUS

## 2019-10-02 MED ORDER — PROPRANOLOL HCL ER 80 MG PO CP24
80.0000 mg | ORAL_CAPSULE | Freq: Every day | ORAL | Status: DC
  Administered 2019-10-02: 80 mg via ORAL
  Filled 2019-10-02: qty 1

## 2019-10-02 MED ORDER — CEFAZOLIN SODIUM-DEXTROSE 2-4 GM/100ML-% IV SOLN
2.0000 g | INTRAVENOUS | Status: AC
  Administered 2019-10-02: 2 g via INTRAVENOUS
  Filled 2019-10-02: qty 100

## 2019-10-02 MED ORDER — ONDANSETRON HCL 4 MG/2ML IJ SOLN: 4.0000 mg | Freq: Four times a day (QID) | INTRAMUSCULAR | Status: DC | PRN

## 2019-10-02 MED ORDER — SODIUM CHLORIDE 0.45 % IV SOLN: INTRAVENOUS | Status: DC

## 2019-10-02 MED ORDER — TRAMADOL HCL 50 MG PO TABS
50.0000 mg | ORAL_TABLET | Freq: Four times a day (QID) | ORAL | Status: DC | PRN
  Administered 2019-10-02: 50 mg via ORAL
  Filled 2019-10-02: qty 1

## 2019-10-02 MED ORDER — LACTATED RINGERS IV SOLN: INTRAVENOUS | Status: DC

## 2019-10-02 MED ORDER — LIDOCAINE HCL (CARDIAC) PF 100 MG/5ML IV SOSY
PREFILLED_SYRINGE | INTRAVENOUS | Status: DC | PRN
  Administered 2019-10-02: 60 mg via INTRAVENOUS

## 2019-10-02 MED ORDER — MIDAZOLAM HCL 2 MG/2ML IJ SOLN
INTRAMUSCULAR | Status: AC
  Filled 2019-10-02: qty 2

## 2019-10-02 MED ORDER — ACETAMINOPHEN 500 MG PO TABS
1000.0000 mg | ORAL_TABLET | Freq: Once | ORAL | Status: AC
  Administered 2019-10-02: 1000 mg via ORAL
  Filled 2019-10-02: qty 2

## 2019-10-02 MED ORDER — FENTANYL CITRATE (PF) 100 MCG/2ML IJ SOLN
25.0000 ug | INTRAMUSCULAR | Status: DC | PRN
  Administered 2019-10-02: 50 ug via INTRAVENOUS

## 2019-10-02 MED ORDER — OXYCODONE HCL 5 MG PO TABS: 5.0000 mg | ORAL_TABLET | ORAL | 0 refills | Status: DC | PRN

## 2019-10-02 MED ORDER — 0.9 % SODIUM CHLORIDE (POUR BTL) OPTIME
TOPICAL | Status: DC | PRN
  Administered 2019-10-02: 1000 mL

## 2019-10-02 MED ORDER — ONDANSETRON 4 MG PO TBDP: 4.0000 mg | ORAL_TABLET | Freq: Four times a day (QID) | ORAL | Status: DC | PRN

## 2019-10-02 MED ORDER — SCOPOLAMINE 1 MG/3DAYS TD PT72: 1.0000 | MEDICATED_PATCH | TRANSDERMAL | Status: DC

## 2019-10-02 MED ORDER — PROPOFOL 10 MG/ML IV BOLUS
INTRAVENOUS | Status: AC
  Filled 2019-10-02: qty 40

## 2019-10-02 MED ORDER — FENTANYL CITRATE (PF) 100 MCG/2ML IJ SOLN
INTRAMUSCULAR | Status: AC
  Filled 2019-10-02: qty 2

## 2019-10-02 MED ORDER — LIDOCAINE 2% (20 MG/ML) 5 ML SYRINGE
INTRAMUSCULAR | Status: AC
  Filled 2019-10-02: qty 5

## 2019-10-02 MED ORDER — ACETAMINOPHEN 325 MG PO TABS: 650.0000 mg | ORAL_TABLET | Freq: Four times a day (QID) | ORAL | Status: DC | PRN

## 2019-10-02 MED ORDER — ROCURONIUM BROMIDE 10 MG/ML (PF) SYRINGE
PREFILLED_SYRINGE | INTRAVENOUS | Status: AC
  Filled 2019-10-02: qty 10

## 2019-10-02 MED ORDER — HYDROMORPHONE HCL 1 MG/ML IJ SOLN: 1.0000 mg | INTRAMUSCULAR | Status: DC | PRN

## 2019-10-02 MED ORDER — FENTANYL CITRATE (PF) 100 MCG/2ML IJ SOLN
INTRAMUSCULAR | Status: AC
  Administered 2019-10-02: 50 ug via INTRAVENOUS
  Filled 2019-10-02: qty 2

## 2019-10-02 MED ORDER — FENTANYL CITRATE (PF) 250 MCG/5ML IJ SOLN
INTRAMUSCULAR | Status: AC
  Filled 2019-10-02: qty 5

## 2019-10-02 MED ORDER — PROPOFOL 10 MG/ML IV BOLUS
INTRAVENOUS | Status: DC | PRN
  Administered 2019-10-02: 150 mg via INTRAVENOUS

## 2019-10-02 MED ORDER — PROPOFOL 1000 MG/100ML IV EMUL
INTRAVENOUS | Status: AC
  Filled 2019-10-02: qty 100

## 2019-10-02 MED ORDER — CHLORHEXIDINE GLUCONATE 0.12 % MT SOLN
15.0000 mL | Freq: Once | OROMUCOSAL | Status: AC
  Administered 2019-10-02: 15 mL via OROMUCOSAL

## 2019-10-02 MED ORDER — OXYCODONE HCL 5 MG PO TABS
5.0000 mg | ORAL_TABLET | ORAL | Status: DC | PRN
  Administered 2019-10-02: 10 mg via ORAL
  Administered 2019-10-02 – 2019-10-03 (×2): 5 mg via ORAL
  Filled 2019-10-02: qty 2
  Filled 2019-10-02 (×2): qty 1

## 2019-10-02 MED ORDER — PROPOFOL 500 MG/50ML IV EMUL
INTRAVENOUS | Status: AC
  Filled 2019-10-02: qty 50

## 2019-10-02 MED ORDER — CALCIUM CARBONATE ANTACID 500 MG PO CHEW
2.0000 | CHEWABLE_TABLET | Freq: Three times a day (TID) | ORAL | 1 refills | Status: DC
Start: 2019-10-02 — End: 2020-04-19

## 2019-10-02 MED ORDER — DEXAMETHASONE SODIUM PHOSPHATE 10 MG/ML IJ SOLN
INTRAMUSCULAR | Status: AC
  Filled 2019-10-02: qty 1

## 2019-10-02 MED ORDER — MIDAZOLAM HCL 2 MG/2ML IJ SOLN
INTRAMUSCULAR | Status: DC | PRN
  Administered 2019-10-02: 2 mg via INTRAVENOUS

## 2019-10-02 MED ORDER — SCOPOLAMINE 1 MG/3DAYS TD PT72
MEDICATED_PATCH | TRANSDERMAL | Status: AC
  Administered 2019-10-02: 1.5 mg via TRANSDERMAL
  Filled 2019-10-02: qty 1

## 2019-10-02 MED ORDER — CALCIUM CARBONATE 1250 (500 CA) MG PO TABS
2.0000 | ORAL_TABLET | Freq: Three times a day (TID) | ORAL | Status: DC
  Administered 2019-10-02 – 2019-10-03 (×4): 1000 mg via ORAL
  Filled 2019-10-02 (×7): qty 1

## 2019-10-02 MED ORDER — ONDANSETRON HCL 4 MG/2ML IJ SOLN
INTRAMUSCULAR | Status: DC | PRN
  Administered 2019-10-02: 4 mg via INTRAVENOUS

## 2019-10-02 MED ORDER — ROCURONIUM BROMIDE 10 MG/ML (PF) SYRINGE
PREFILLED_SYRINGE | INTRAVENOUS | Status: DC | PRN
  Administered 2019-10-02: 10 mg via INTRAVENOUS
  Administered 2019-10-02: 60 mg via INTRAVENOUS

## 2019-10-02 SURGICAL SUPPLY — 31 items
ADH SKN CLS APL DERMABOND .7 (GAUZE/BANDAGES/DRESSINGS) ×1
APL PRP STRL LF DISP 70% ISPRP (MISCELLANEOUS) ×1
ATTRACTOMAT 16X20 MAGNETIC DRP (DRAPES) ×3 IMPLANT
BLADE SURG 15 STRL LF DISP TIS (BLADE) ×1 IMPLANT
BLADE SURG 15 STRL SS (BLADE) ×3
CHLORAPREP W/TINT 26 (MISCELLANEOUS) ×3 IMPLANT
CLIP VESOCCLUDE MED 6/CT (CLIP) ×11 IMPLANT
CLIP VESOCCLUDE SM WIDE 6/CT (CLIP) ×12 IMPLANT
COVER SURGICAL LIGHT HANDLE (MISCELLANEOUS) ×3 IMPLANT
COVER WAND RF STERILE (DRAPES) ×3 IMPLANT
DERMABOND ADVANCED (GAUZE/BANDAGES/DRESSINGS) ×2
DERMABOND ADVANCED .7 DNX12 (GAUZE/BANDAGES/DRESSINGS) ×1 IMPLANT
DRAPE LAPAROTOMY T 98X78 PEDS (DRAPES) ×3 IMPLANT
ELECT PENCIL ROCKER SW 15FT (MISCELLANEOUS) ×3 IMPLANT
ELECT REM PT RETURN 15FT ADLT (MISCELLANEOUS) ×3 IMPLANT
GAUZE 4X4 16PLY RFD (DISPOSABLE) ×3 IMPLANT
GLOVE SURG ORTHO 8.0 STRL STRW (GLOVE) ×3 IMPLANT
GOWN STRL REUS W/TWL XL LVL3 (GOWN DISPOSABLE) ×6 IMPLANT
HEMOSTAT SURGICEL 2X4 FIBR (HEMOSTASIS) ×1 IMPLANT
ILLUMINATOR WAVEGUIDE N/F (MISCELLANEOUS) ×3 IMPLANT
KIT BASIN (CUSTOM PROCEDURE TRAY) ×3 IMPLANT
KIT TURNOVER KIT A (KITS) IMPLANT
PACK BASIC VI WITH GOWN DISP (CUSTOM PROCEDURE TRAY) ×3 IMPLANT
SHEARS HARMONIC 9CM CVD (BLADE) ×3 IMPLANT
SUT MNCRL AB 4-0 PS2 18 (SUTURE) ×3 IMPLANT
SUT VIC AB 3-0 SH 18 (SUTURE) ×6 IMPLANT
SYR BULB IRRIG 60ML STRL (SYRINGE) ×3 IMPLANT
TOWEL OR 17X26 10 PK STRL BLUE (TOWEL DISPOSABLE) ×3 IMPLANT
TOWEL OR NON WOVEN STRL DISP B (DISPOSABLE) ×3 IMPLANT
TUBING CONNECTING 10 (TUBING) ×2 IMPLANT
TUBING CONNECTING 10' (TUBING) ×1

## 2019-10-02 NOTE — Discharge Instructions (Signed)
[facility]  THYROID & PARATHYROID SURGERY:  POST-OP INSTRUCTIONS  Always review your discharge instruction sheet from the facility where your surgery was performed.  A prescription for pain medication may be given to you upon discharge.  Take your pain medication as prescribed.  If narcotic pain medicine is not needed, then you may take acetaminophen (Tylenol) or ibuprofen (Advil) as needed.  Take your usually prescribed medications unless otherwise directed.  If you need a refill on your pain medication, please contact our office during regular business hours.  Prescriptions cannot be processed by our office after 5 pm or on weekends.  Start with a light diet upon arrival home, such as soup and crackers or toast.  Be sure to drink plenty of fluids daily.  Resume your normal diet the day after surgery.  Most patients will experience some swelling and bruising on the chest and neck area.  Ice packs will help.  Swelling and bruising can take several days to resolve.   It is common to experience some constipation after surgery.  Increasing fluid intake and taking a stool softener (Colace) will usually help or prevent this problem.  A mild laxative (Milk of Magnesia or Miralax) should be taken according to package directions if there has been no bowel movement after 48 hours.  You have steri-strips and a gauze dressing over your incision.  You may remove the gauze bandage on the second day after surgery, and you may shower at that time.  Leave your steri-strips (small skin tapes) in place directly over the incision.  These strips should remain on the skin for 5-7 days and then be removed.  You may get them wet in the shower and pat them dry.  You may resume regular (light) daily activities beginning the next day (such as daily self-care, walking, climbing stairs) gradually increasing activities as tolerated.  You may have sexual intercourse when it is comfortable.  Refrain from  any heavy lifting or straining until approved by your doctor.  You may drive when you no longer are taking prescription pain medication, you can comfortably wear a seatbelt, and you can safely maneuver your car and apply brakes.  You should see your doctor in the office for a follow-up appointment approximately three weeks after your surgery.  Make sure that you call for this appointment within a day or two after you arrive home to insure a convenient appointment time.  WHEN TO CALL YOUR DOCTOR: -- Fever greater than 101.5 -- Inability to urinate -- Nausea and/or vomiting - persistent -- Extreme swelling or bruising -- Continued bleeding from incision -- Increased pain, redness, or drainage from the incision -- Difficulty swallowing or breathing -- Muscle cramping or spasms -- Numbness or tingling in hands or around lips  The clinic staff is available to answer your questions during regular business hours.  Please don't hesitate to call and ask to speak to one of the nurses if you have concerns.  Robey Massmann, MD [facility] Office: [phone] 

## 2019-10-02 NOTE — Anesthesia Postprocedure Evaluation (Signed)
Anesthesia Post Note  Patient: Brandy Hansen  Procedure(s) Performed: TOTAL THYROIDECTOMY (N/A )     Patient location during evaluation: PACU Anesthesia Type: General Level of consciousness: awake and alert Pain management: pain level controlled Vital Signs Assessment: post-procedure vital signs reviewed and stable Respiratory status: spontaneous breathing, nonlabored ventilation, respiratory function stable and patient connected to nasal cannula oxygen Cardiovascular status: blood pressure returned to baseline and stable Postop Assessment: no apparent nausea or vomiting Anesthetic complications: no   No complications documented.  Last Vitals:  Vitals:   10/02/19 0930 10/02/19 0945  BP: (!) 130/93 130/80  Pulse: 98 90  Resp: 20 13  Temp:    SpO2: 100% 100%    Last Pain:  Vitals:   10/02/19 0945  TempSrc:   PainSc: 4                  Brandy Hansen

## 2019-10-02 NOTE — Transfer of Care (Signed)
Immediate Anesthesia Transfer of Care Note  Patient: Brandy Hansen  Procedure(s) Performed: TOTAL THYROIDECTOMY (N/A )  Patient Location: PACU  Anesthesia Type:General  Level of Consciousness: awake, drowsy, patient cooperative and responds to stimulation  Airway & Oxygen Therapy: Patient Spontanous Breathing and Patient connected to face mask oxygen  Post-op Assessment: Report given to RN and Post -op Vital signs reviewed and stable  Post vital signs: Reviewed and stable  Last Vitals:  Vitals Value Taken Time  BP 138/101 10/02/19 0924  Temp    Pulse 96 10/02/19 0929  Resp 23 10/02/19 0929  SpO2 100 % 10/02/19 0929  Vitals shown include unvalidated device data.  Last Pain:  Vitals:   10/02/19 0535  TempSrc: Oral      Patients Stated Pain Goal: 3 (10/02/19 8110)  Complications: No complications documented.

## 2019-10-02 NOTE — Anesthesia Procedure Notes (Signed)
Procedure Name: Intubation Date/Time: 10/02/2019 7:25 AM Performed by: Raenette Rover, CRNA Pre-anesthesia Checklist: Patient identified, Emergency Drugs available, Suction available and Patient being monitored Patient Re-evaluated:Patient Re-evaluated prior to induction Oxygen Delivery Method: Circle system utilized Preoxygenation: Pre-oxygenation with 100% oxygen Induction Type: IV induction Ventilation: Mask ventilation without difficulty Laryngoscope Size: Mac and 3 Grade View: Grade I Tube type: Oral Tube size: 7.0 mm Number of attempts: 1 Airway Equipment and Method: Stylet Placement Confirmation: ETT inserted through vocal cords under direct vision,  positive ETCO2 and breath sounds checked- equal and bilateral Secured at: 21 cm Tube secured with: Tape Dental Injury: Teeth and Oropharynx as per pre-operative assessment

## 2019-10-02 NOTE — Interval H&P Note (Signed)
History and Physical Interval Note:  10/02/2019 6:57 AM  Brandy Hansen  has presented today for surgery, with the diagnosis of GRAVES' DISEASE.  The various methods of treatment have been discussed with the patient and family. After consideration of risks, benefits and other options for treatment, the patient has consented to    Procedure(s): TOTAL THYROIDECTOMY (N/A) as a surgical intervention.    The patient's history has been reviewed, patient examined, no change in status, stable for surgery.  I have reviewed the patient's chart and labs.  Questions were answered to the patient's satisfaction.    Darnell Level, MD [facility] Office: [phone]   Darnell Level

## 2019-10-02 NOTE — Op Note (Signed)
Procedure Note  Pre-operative Diagnosis:  Graves' disease  Post-operative Diagnosis:  same  Surgeon:  Brandy Level, MD  Assistant:  none   Procedure:  Total thyroidectomy  Anesthesia:  General  Estimated Blood Loss:  minimal  Drains: none         Specimen: thyroid to pathology  Indications:  Patient is referred by Dr. Carlus Hansen for surgical evaluation and management of hyperthyroidism due to Graves' disease. Patient was diagnosed with an enlarged thyroid gland approximately 7 years ago. She underwent an ultrasound in June 2014. This showed mild enlargement of the thyroid gland but no significant nodules were identified. Approximately 4 months ago the patient developed signs and symptoms of hyperthyroidism. TSH Hansen was markedly suppressed. The patient has had multiple complaints but most prominently notes fatigue as well as palpitations. She did have a significant tremor but that has improved while on antibiotic thyroid medication. 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 She is taking Zyrtec to help with the allergic reaction. Patient has discussed radioactive iodine treatment with her endocrinologist. Due to having small children at home and they desire to have additional children in the near future, the patient would like to consider surgical resection for management of hyperthyroidism. Patient is had no prior head or neck surgery. There is no family history of thyroid disease or endocrine neoplasms. Patient presents today to discuss thyroid surgery for management of hyperthyroidism.  Procedure Details: Procedure was done in OR #1 at the [facility]. The patient was brought to the operating room and placed in a supine position on the operating room table. Following administration of general anesthesia, the patient was positioned and then prepped and draped in the usual aseptic fashion. After ascertaining that an adequate Hansen  of anesthesia had been achieved, a small Kocher incision was made with #15 blade. Dissection was carried through subcutaneous tissues and platysma.Hemostasis was achieved with the electrocautery. Skin flaps were elevated cephalad and caudad from the thyroid notch to the sternal notch. A Brandy Hansen self-retaining retractor was placed for exposure. Strap muscles were incised in the midline and dissection was begun on the left side.  Strap muscles were reflected laterally.  Left thyroid lobe was moderately enlarged without nodules.  The left lobe was gently mobilized with blunt dissection. Superior pole vessels were dissected out and divided individually between small and medium ligaclips with the harmonic scalpel. The thyroid lobe was rolled anteriorly. Branches of the inferior thyroid artery were divided between small ligaclips with the harmonic scalpel. Inferior venous tributaries were divided between ligaclips. Both the superior and inferior parathyroid glands were identified and preserved on their vascular pedicles. The recurrent laryngeal nerve was identified and preserved along its course. The ligament of Brandy Hansen was released with the electrocautery and the gland was mobilized onto the anterior trachea. Isthmus was mobilized across the midline. There was no pyramidal lobe present. Dry pack was placed in the left neck.  The right thyroid lobe was gently mobilized with blunt dissection. Right thyroid lobe was moderately enlarged. Superior pole vessels were dissected out and divided between small and medium ligaclips with the Harmonic scalpel. Superior parathyroid was identified and preserved. Inferior venous tributaries were divided between medium ligaclips with the harmonic scalpel. The right thyroid lobe was rolled anteriorly and the branches of the inferior thyroid artery divided between small ligaclips. The right recurrent laryngeal nerve was identified and preserved along its course. The ligament of Brandy Hansen was  released with the electrocautery. The right thyroid  lobe was mobilized onto the anterior trachea and the remainder of the thyroid was dissected off the anterior trachea and the thyroid was completely excised. A suture was used to mark the right lobe. The entire thyroid gland was submitted to pathology for review.  The neck was irrigated with warm saline. Fibrillar was placed throughout the operative field. Strap muscles were approximated in the midline with interrupted 3-0 Vicryl sutures. Platysma was closed with interrupted 3-0 Vicryl sutures. Skin was closed with a running 4-0 Monocryl subcuticular suture. Wound was washed and Dermabond was applied. The patient was awakened from anesthesia and brought to the recovery room. The patient tolerated the procedure well.   Armandina Gemma, MD [facility] Office: [phone]

## 2019-10-03 ENCOUNTER — Encounter (HOSPITAL_COMMUNITY): Payer: Self-pay | Admitting: Surgery

## 2019-10-03 DIAGNOSIS — E0500 Thyrotoxicosis with diffuse goiter without thyrotoxic crisis or storm: Secondary | ICD-10-CM | POA: Diagnosis not present

## 2019-10-03 LAB — BASIC METABOLIC PANEL
Anion gap: 10 (ref 5–15)
BUN: 11 mg/dL (ref 6–20)
CO2: 26 mmol/L (ref 22–32)
Calcium: 8.3 mg/dL — ABNORMAL LOW (ref 8.9–10.3)
Chloride: 101 mmol/L (ref 98–111)
Creatinine, Ser: 0.43 mg/dL — ABNORMAL LOW (ref 0.44–1.00)
GFR calc Af Amer: >60 mL/min (ref >60)
GFR calc non Af Amer: >60 mL/min (ref >60)
Glucose, Bld: 132 mg/dL — ABNORMAL HIGH (ref 70–99)
Potassium: 3.7 mmol/L (ref 3.5–5.1)
Sodium: 137 mmol/L (ref 135–145)

## 2019-10-03 NOTE — Plan of Care (Signed)

## 2019-10-03 NOTE — Discharge Summary (Signed)
Physician Discharge Summary  Patient ID: Brandy Hansen MRN: [id number] DOB/AGE: Nov 24, 1985 34 y.o.  Admit date: 10/02/2019 Discharge date: 10/03/2019  Admission Diagnoses: Graves disease  Discharge Diagnoses:  Principal Problem:   Graves' disease Active Problems:   Enlarged thyroid   Graves disease   Discharged Condition: good  Hospital Course: Admitted for observation and supportive care following total thyroidectomy. On the morning of post-op day 1 patient reports pain is well controlled. No dysphagia or hoarseness. Her vitals and labs are acceptable. She was deemed stable for discharge home.    Discharge Exam: Blood pressure (!) 107/59, pulse (!) 56, temperature 97.9 F (36.6 C), temperature source Oral, resp. rate 16, height 5\' 7"  (1.702 m), weight 67.4 kg, last menstrual period 09/17/2019, SpO2 98 %, unknown if currently breastfeeding. General appearance: alert and cooperative Neck: incision c/d/i with steri strips, no cellulitis, no hematoma or seroma. neck is flat and soft; Mild expected edema and tenderness.  Resp: unlabored  Disposition: Discharge disposition: 01-Home or Self Care        Allergies as of 10/03/2019      Reactions   Codeine Itching   Levothyroxine Sodium Hives   Naproxen Nausea And Vomiting, Rash   Sulfa Antibiotics Rash      Medication List    TAKE these medications   calcium carbonate 500 MG chewable tablet Commonly known as: Tums Chew 2 tablets (400 mg of elemental calcium total) by mouth 3 (three) times daily.   cetirizine 10 MG tablet Commonly known as: ZYRTEC Take 10 mg by mouth at bedtime.   clobetasol cream 0.05 % Commonly known as: TEMOVATE Apply 1 application topically 2 (two) times daily. What changed:   when to take this  reasons to take this   clonazePAM 0.5 MG tablet Commonly known as: KLONOPIN Take 1 tablet (0.5 mg total) by mouth 2 (two) times daily as needed for up to 45 doses for anxiety.   oxyCODONE 5 MG  immediate release tablet Commonly known as: Oxy IR/ROXICODONE Take 1-2 tablets (5-10 mg total) by mouth every 4 (four) hours as needed for moderate pain.   propranolol ER 80 MG 24 hr capsule Commonly known as: INDERAL LA Take 1 capsule (80 mg total) by mouth at bedtime.   QUEtiapine 100 MG tablet Commonly known as: SEROquel 1-2 tabs at night   Vitamin D (Ergocalciferol) 1.25 MG (50000 UNIT) Caps capsule Commonly known as: DRISDOL Take 50,000 Units by mouth once a week.       Follow-up Information    12/03/2019, MD. Schedule an appointment as soon as possible for a visit in 3 weeks.   Specialty: General Surgery Why: For wound re-check Contact information: [address] [place] [phone]               Signed: [phone] 10/03/2019, 8:36 AM

## 2019-10-03 NOTE — Plan of Care (Signed)
  Problem: Coping: Goal: Level of anxiety will decrease 10/03/2019 0145 by Kizzie Bane, RN Outcome: Progressing 10/03/2019 0144 by Kizzie Bane, RN Outcome: Progressing   Problem: Elimination: Goal: Will not experience complications related to bowel motility 10/03/2019 0145 by Kizzie Bane, RN Outcome: Progressing 10/03/2019 0144 by Kizzie Bane, RN Outcome: Progressing Goal: Will not experience complications related to urinary retention 10/03/2019 0145 by Kizzie Bane, RN Outcome: Progressing 10/03/2019 0144 by Kizzie Bane, RN Outcome: Progressing   Problem: Pain Managment: Goal: General experience of comfort will improve 10/03/2019 0145 by Kizzie Bane, RN Outcome: Progressing 10/03/2019 0144 by Kizzie Bane, RN Outcome: Progressing   Problem: Safety: Goal: Ability to remain free from injury will improve 10/03/2019 0145 by Kizzie Bane, RN Outcome: Progressing 10/03/2019 0144 by Kizzie Bane, RN Outcome: Progressing   Problem: Skin Integrity: Goal: Risk for impaired skin integrity will decrease 10/03/2019 0145 by Kizzie Bane, RN Outcome: Progressing 10/03/2019 0144 by Kizzie Bane, RN Outcome: Progressing

## 2019-10-03 NOTE — Progress Notes (Signed)
Discharge instructions given to patient and family.  All questions were answered.  Patient was taken to main entrance in a wheelchair.

## 2019-10-05 ENCOUNTER — Telehealth: Payer: Self-pay | Admitting: Registered Nurse

## 2019-10-05 ENCOUNTER — Ambulatory Visit: Payer: 59 | Admitting: Registered Nurse

## 2019-10-05 NOTE — Telephone Encounter (Addendum)
10/05/2019 - PATIENT HAD  AN OFFICE VISIT SCHEDULED WITH RICH MORROW FOR Monday (10/05/2019) AT 3:30pm FOR A FOLLOW-UP FROM HER RECENT CHEST X-RAY. SHE CALLED THE OFFICE AT 12:36pm TO CANCEL WHEN THE OFFICE WAS CLOSED FOR LUNCH. (CRM) I TRIED TO CALL HER TO RESCHEDULE BUT HAD TO LEAVE A MESSAGE ON HER VOICE MAIL TO RETURN MY CALL. MBC

## 2019-10-05 NOTE — Telephone Encounter (Unsigned)
Copied from CRM [id number]. Topic: Appointment Scheduling - Scheduling Inquiry for Clinic >> Oct 05, 2019 12:36 PM Leafy Ro wrote: Reason for CRM:pt is calling at lunch time and would like to  reschedule her appt with richard she had  today

## 2019-10-06 LAB — SURGICAL PATHOLOGY

## 2019-10-16 ENCOUNTER — Encounter: Payer: Self-pay | Admitting: Internal Medicine

## 2019-10-16 ENCOUNTER — Other Ambulatory Visit: Payer: Self-pay | Admitting: Internal Medicine

## 2019-10-16 DIAGNOSIS — E890 Postprocedural hypothyroidism: Secondary | ICD-10-CM | POA: Insufficient documentation

## 2019-10-16 MED ORDER — LEVOTHYROXINE SODIUM 100 MCG PO TABS
100.0000 ug | ORAL_TABLET | Freq: Every day | ORAL | 3 refills | Status: DC
Start: 2019-10-16 — End: 2019-11-12

## 2019-11-02 ENCOUNTER — Other Ambulatory Visit: Payer: Self-pay

## 2019-11-02 ENCOUNTER — Ambulatory Visit: Payer: 59 | Admitting: Internal Medicine

## 2019-11-02 ENCOUNTER — Encounter: Payer: Self-pay | Admitting: Internal Medicine

## 2019-11-02 VITALS — BP 126/80 | HR 100 | Ht 66.0 in | Wt 151.0 lb

## 2019-11-02 DIAGNOSIS — E890 Postprocedural hypothyroidism: Secondary | ICD-10-CM | POA: Diagnosis not present

## 2019-11-02 NOTE — Patient Instructions (Signed)
  Please continue levothyroxine 100 mcg daily.  Take the thyroid hormone every day, with water, at least 30 minutes before breakfast, separated by at least 4 hours from: - acid reflux medications - calcium - iron - multivitamins  Please come back for labs next Wednesday.  Come back for another visit in 4 months.

## 2019-11-02 NOTE — Progress Notes (Addendum)
Patient ID: Brandy Hansen, female   DOB: 08-13-1985, 34 y.o.   MRN: [id number]   This visit occurred during the SARS-CoV-2 public health emergency.  Safety protocols were in place, including screening questions prior to the visit, additional usage of staff PPE, and extensive cleaning of exam room while observing appropriate contact time as indicated for disinfecting solutions.   HPI  Brandy Hansen is a 34 y.o.-year-old female, initially referred by Janeece Agee, NP, returning for follow-up for thyrotoxicosis and goiter.  Last visit 3 months ago.  She quit her old job at [employer] and will start a new job in 2 weeks - she is happy that she will be starting work later.  Reviewed history: Patient describes that for approximately a year (2020), she has been feeling more hyperactive, lost almost 30 lbs, has heat intolerance, tremors, itching, SOB, palpitations.  These symptoms worsened fall 2020.  She finally presented to see her PCP on 04/29/2019 and thyroid tests showed thyrotoxicosis.  She was immediately referred to endocrinology.  On 05/11/2019, we diagnosed Graves' disease based on elevated TSI antibodies and started her on methimazole 10 mg bid.  However, she developed a rash with methimazole and we had to switch to PTU 50 mg tid in 06/2019.  Since last visit, she had total thyroidectomy by Dr. Gerrit Friends on 10/02/2019.  She was much better after the surgery with only some fatigue.  We only started levothyroxine on 10/16/2019.  She is on 100 mcg daily: - in am - fasting - at least 30 min from b'fast - + 2x Ca - at night, no Fe, MVI, PPIs - not on Biotin  I reviewed her TFTs -latest TSH was from before her surgery: Lab Results  Component Value Date   TSH <0.01 (L) 08/18/2019   TSH <0.01 (L) 07/14/2019   TSH <0.01 (L) 06/09/2019   TSH <0.01 Repeated and verified X2. (L) 05/11/2019   TSH <0.005 (L) 04/29/2019   FREET4 2.01 (H) 08/18/2019   FREET4 0.92 07/14/2019    FREET4 1.60 06/09/2019   FREET4 3.83 (H) 05/11/2019   T3FREE 4.7 (H) 08/18/2019   T3FREE 3.9 07/14/2019   T3FREE 5.2 (H) 06/09/2019   T3FREE 13.0 (H) 05/11/2019   Component     Latest Ref Rng & Units 04/29/2019  TSH     0.450 - 4.500 uIU/mL <0.005 (L)  Thyroxine (T4)     4.5 - 12.0 ug/dL >93.2 (HH)  T3 Uptake Ratio     24 - 39 % 56 (H)  Free Thyroxine Index     1.2 - 4.9 >13.9 (H)   Her antithyroid antibodies were elevated:: Lab Results  Component Value Date   TSI 145 (H) 05/11/2019  04/29/2019: ATA antibodies <1, TPO antibodies 355 (0-34)  Thyroid ultrasound (10/07/2012): Mildly, enlarged, homogeneous thyroid gland  As of now, she has no neck compression symptoms: - feeling nodules in neck - hoarseness - dysphagia - choking - SOB with lying down She did have complaints of dysphagia and occasional choking with saliva and shortness of breath with laying down.  While hyperthyroid, she had: Fatigue, insomnia (chronic-on Seroquel), heat intolerance, tremors, anxiety, palpitations, hyper defecation, weight loss, easy bruising.  At this visit: She feels much better after her thyroidectomy but she is not sure whether this is related to waking up very early: 4 AM until she quit her prior job or the actual surgery.  Pt does not have a FH of thyroid ds. No FH of thyroid cancer. No  h/o radiation tx to head or neck.  No herbal supplements. No Biotin use. No recent steroids use.   Mother with DM1.  She also has a history of anemia with Hb of 6.6!  Last pregnancy 07/2018.  She has 2 children.  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  ROS: Constitutional: no weight gain/no weight loss, + fatigue, no subjective hyperthermia, no subjective hypothermia Eyes: no blurry vision, no xerophthalmia ENT: no sore throat, + see HPI Cardiovascular: no CP/no SOB/no palpitations/no leg swelling Respiratory: no cough/no SOB/no  wheezing Gastrointestinal: no N/no V/no D/no C/no acid reflux Musculoskeletal: no muscle aches/no joint aches Skin: no rashes, no hair loss Neurological: no tremors/no numbness/no tingling/no dizziness  I reviewed pt's medications, allergies, PMH, social hx, family hx, and changes were documented in the history of present illness. Otherwise, unchanged from my initial visit note.  Past Medical History:  Diagnosis Date  . Anemia   . Anxiety   . Arthritis    knees  . Bipolar 1 disorder (HCC)   . Chronic kidney disease    ? problems as child, bladder"stretched" fine since  . Depression   . Fast heart beat   . Gestational diabetes    Gestational  . HPV (human papilloma virus) anogenital infection   . Hypertension   . Ovarian cyst   . PONV (postoperative nausea and vomiting)   . SVD (spontaneous vaginal delivery) 07/24/2018   Past Surgical History:  Procedure Laterality Date  . CHONDROPLASTY Right 10/27/2014   Procedure: CHONDROPLASTY;  Surgeon: Jodi GeraldsJohn Graves, MD;  Location: [facility];  Service: Orthopedics;  Laterality: Right;  . DILATION AND EVACUATION N/A 08/20/2016   Procedure: DILATATION AND EVACUATION;  Surgeon: Essie HartWalda Pinn, MD;  Location: [facility];  Service: Gynecology;  Laterality: N/A;  . KNEE ARTHROSCOPY Right 10/27/2014   Procedure: ARTHROSCOPY KNEE with chondroplasty of medial femeral condyle and femoral patella joint;  Surgeon: Jodi GeraldsJohn Graves, MD;  Location:  SUR[facility]ERY [facility];  Service: Orthopedics;  Laterality: Right;  . LAPAROSCOPY  06/24/2011   Procedure: LAPAROSCOPY OPERATIVE;  Surgeon: Lesly DukesKelly H. Leggett, MD;  Location: [facility];  Service: Gynecology;  Laterality: Right;  operative laparoscopy and right salpingectomy with removal of ectopic pregnancy  . THYROIDECTOMY N/A 10/02/2019   Procedure: TOTAL THYROIDECTOMY;  Surgeon: Darnell LevelGerkin, Todd, MD;  Location: [facility];  Service: General;  Laterality: N/A;  . WRIST SURGERY     Social History   Socioeconomic History  .  Marital status: Married    Spouse name: Not on file  . Number of children: 2: 9 years and 9 months in 04/2019  . Years of education: Not on file  . Highest education level: Not on file  Occupational History  .  Varies diet [  Tobacco Use  . Smoking status: Former Smoker, now vapes    Types: Cigarettes    Quit date: 11/23/2017    Years since quitting: 1.4  . Smokeless tobacco: Never Used  Substance and Sexual Activity  . Alcohol use:     Comment: rarely, beer  . Drug use: No   Social Determinants of Health   Financial Resource Strain:   . Difficulty of Paying Living Expenses: Not on file  Food Insecurity:   . Worried About Programme researcher, broadcasting/film/videounning Out of Food in the Last Year: Not on file  . Ran Out of Food in the Last Year: Not on file  Transportation Needs:   . Lack of Transportation (  Medical): Not on file  . Lack of Transportation (Non-Medical): Not on file  Physical Activity:   . Days of Exercise per Week: Not on file  . Minutes of Exercise per Session: Not on file  Stress:   . Feeling of Stress : Not on file  Social Connections:   . Frequency of Communication with Friends and Family: Not on file  . Frequency of Social Gatherings with Friends and Family: Not on file  . Attends Religious Services: Not on file  . Active Member of Clubs or Organizations: Not on file  . Attends Banker Meetings: Not on file  . Marital Status: Not on file  Intimate Partner Violence:   . Fear of Current or Ex-Partner: Not on file  . Emotionally Abused: Not on file  . Physically Abused: Not on file  . Sexually Abused: Not on file   Current Outpatient Medications on File Prior to Visit  Medication Sig Dispense Refill  . calcium carbonate (TUMS) 500 MG chewable tablet Chew 2 tablets (400 mg of elemental calcium total) by mouth 3 (three) times daily. 90 tablet 1  . cetirizine (ZYRTEC) 10 MG tablet Take 10 mg by mouth at bedtime.    . clobetasol cream (TEMOVATE) 0.05 % Apply 1 application  topically 2 (two) times daily. (Patient taking differently: Apply 1 application topically 2 (two) times daily as needed (rash). ) 30 g 0  . clonazePAM (KLONOPIN) 0.5 MG tablet Take 1 tablet (0.5 mg total) by mouth 2 (two) times daily as needed for up to 45 doses for anxiety. 45 tablet 3  . levothyroxine (SYNTHROID) 100 MCG tablet Take 1 tablet (100 mcg total) by mouth daily before breakfast. 45 tablet 3  . oxyCODONE (OXY IR/ROXICODONE) 5 MG immediate release tablet Take 1-2 tablets (5-10 mg total) by mouth every 4 (four) hours as needed for moderate pain. 15 tablet 0  . propranolol ER (INDERAL LA) 80 MG 24 hr capsule Take 1 capsule (80 mg total) by mouth at bedtime. 90 capsule 3  . Vitamin D, Ergocalciferol, (DRISDOL) 1.25 MG (50000 UNIT) CAPS capsule Take 50,000 Units by mouth once a week.    Marland Kitchen QUEtiapine (SEROQUEL) 100 MG tablet 1-2 tabs at night (Patient not taking: Reported on 09/24/2019) 60 tablet 0   No current facility-administered medications on file prior to visit.   Allergies  Allergen Reactions  . Codeine Itching  . Levothyroxine Sodium Hives  . Naproxen Nausea And Vomiting and Rash  . Sulfa Antibiotics Rash   Family History  Problem Relation Age of Onset  . Diabetes Mother   . Cancer Mother   . Hypertension Father   . Cancer Father   . Hyperlipidemia Father   . Cancer Maternal Grandmother   . Hyperlipidemia Maternal Grandmother   . Cancer Maternal Grandfather   . Cancer Paternal Grandmother   . Cancer Paternal Grandfather   . Stroke Paternal Grandfather   . Anesthesia problems Neg Hx    PE: BP 126/80   Pulse 100   Ht 5\' 6"  (1.676 m)   Wt 151 lb (68.5 kg)   SpO2 98%   BMI 24.37 kg/m  Wt Readings from Last 3 Encounters:  11/02/19 151 lb (68.5 kg)  10/02/19 148 lb 9 oz (67.4 kg)  09/29/19 148 lb 9 oz (67.4 kg)   Constitutional: Normal weight, in NAD Eyes: PERRLA, EOMI, no exophthalmos ENT: moist mucous membranes, no neck masses, thyroidectomy scar almost entirely  healed, without keloid, swelling, erythema, or dysesthesia; no  cervical lymphadenopathy Cardiovascular: Tachycardia RR, No MRG Respiratory: CTA B Gastrointestinal: abdomen soft, NT, ND, BS+ Musculoskeletal: no deformities, strength intact in all 4 Skin: moist, warm, no rashes Neurological: no tremor with outstretched hands, DTR normal in all 4  ASSESSMENT: 1.  Postsurgical hypothyroidism  2. S/p total thyroidectomy  PLAN:  1. Patient with history of uncontrolled Graves' disease with intolerance to methimazole due to rash and now status post thyroidectomy.  She feels much better after the surgery!  -No signs of Graves' ophthalmopathy: No eye pain, double vision, chemosis - latest thyroid labs reviewed with pt >> suppressed TSH but this was before her surgery: Lab Results  Component Value Date   TSH <0.01 (L) 08/18/2019   - she continues on LT4 100 mcg daily-started less than 3 weeks ago - pt feels good on this dose except for some fatigue - we discussed about taking the thyroid hormone every day, with water, >30 minutes before breakfast, separated by >4 hours from acid reflux medications, calcium, iron, multivitamins. Pt. is taking it correctly. - will check thyroid tests in 10 days: TSH and fT4 - If labs are abnormal, she will need to return for repeat TFTs in 1.5 months - I will see her back in 4 months but most likely sooner for labs  2. S/p total thyroidectomy -Thyroidectomy scar is healing beautifully.  She has no swelling, erythema, or keloid itching, pain at the incision site. -She is still taking calcium at night (2 Tums tablets) -We will recheck her calcium when she returns for thyroid labs, in 10 days and I will advise him whether she can stop the calcium or not at that time.  We did discuss at this visit about signs and symptoms of hypocalcemia so that she knows what to pay attention for after we stop calcium.  Orders Placed This Encounter  Procedures  . TSH  . T4, free   . Calcium   Component     Latest Ref Rng & Units 11/11/2019  Calcium     8.4 - 10.5 mg/dL 9.4  TSH     6.01 - 0.93 uIU/mL 23.37 (H)  T4,Free(Direct)     0.60 - 1.60 ng/dL 2.35   Calcium level normal. Will stop Tums. TSH quite high.  Will increase her levothyroxine dose to 125 mcg daily and recheck her test in 1.5 months.  Carlus Pavlov, MD PhD [facility] Endocrinology

## 2019-11-10 ENCOUNTER — Other Ambulatory Visit: Payer: Self-pay

## 2019-11-10 ENCOUNTER — Encounter: Payer: Self-pay | Admitting: Registered Nurse

## 2019-11-10 ENCOUNTER — Ambulatory Visit (INDEPENDENT_AMBULATORY_CARE_PROVIDER_SITE_OTHER): Payer: 59 | Admitting: Registered Nurse

## 2019-11-10 VITALS — BP 125/84 | HR 98 | Temp 98.0°F | Resp 18 | Ht 66.0 in | Wt 150.0 lb

## 2019-11-10 DIAGNOSIS — R9389 Abnormal findings on diagnostic imaging of other specified body structures: Secondary | ICD-10-CM

## 2019-11-10 DIAGNOSIS — Z7689 Persons encountering health services in other specified circumstances: Secondary | ICD-10-CM | POA: Diagnosis not present

## 2019-11-10 NOTE — Progress Notes (Signed)
Established Patient Office Visit  Subjective:  Patient ID: Brandy Hansen, female    DOB: 17-Feb-1986  Age: 34 y.o. MRN: [id number]  CC:  Chief Complaint  Patient presents with  . Transitions Of Care    Patient state she went and had surgery for Thyroid and had an xray on her lung and said that they had found something so she would like to discuss    HPI [name] presents for visit to establish care.  Had recently established with Janne Lab, who is no longer with our clinic.   Had established dx of Grave's Disease. Total thyroidectomy on 10/02/19. Went well, recovering appropriately, attending all follow ups with endo  On pre op testing, incidental abnormality of lung found. Nodule vs parenchymal scarring. Recommended CT follow up. Pt hopes to discuss abnormality, outlook, and plan.  No other concerns at this time.   Past Medical History:  Diagnosis Date  . Anemia   . Anxiety   . Arthritis    knees  . Bipolar 1 disorder (HCC)   . Chronic kidney disease    ? problems as child, bladder"stretched" fine since  . Depression   . Fast heart beat   . Gestational diabetes    Gestational  . HPV (human papilloma virus) anogenital infection   . Hypertension   . Ovarian cyst   . PONV (postoperative nausea and vomiting)   . SVD (spontaneous vaginal delivery) 07/24/2018    Past Surgical History:  Procedure Laterality Date  . CHONDROPLASTY Right 10/27/2014   Procedure: CHONDROPLASTY;  Surgeon: Jodi Geralds, MD;  Location: [facility];  Service: Orthopedics;  Laterality: Right;  . DILATION AND EVACUATION N/A 08/20/2016   Procedure: DILATATION AND EVACUATION;  Surgeon: Essie Hart, MD;  Location: [facility];  Service: Gynecology;  Laterality: N/A;  . KNEE ARTHROSCOPY Right 10/27/2014   Procedure: ARTHROSCOPY KNEE with chondroplasty of medial femeral condyle and femoral patella joint;  Surgeon: Jodi Geralds, MD;  Location: [facility];  Service: Orthopedics;   Laterality: Right;  . LAPAROSCOPY  06/24/2011   Procedure: LAPAROSCOPY OPERATIVE;  Surgeon: Lesly Dukes, MD;  Location: [facility];  Service: Gynecology;  Laterality: Right;  operative laparoscopy and right salpingectomy with removal of ectopic pregnancy  . THYROIDECTOMY N/A 10/02/2019   Procedure: TOTAL THYROIDECTOMY;  Surgeon: Darnell Level, MD;  Location: [facility];  Service: General;  Laterality: N/A;  . WRIST SURGERY      Family History  Problem Relation Age of Onset  . Diabetes Mother   . Breast cancer Mother 13  . Hypertension Father   . Hyperlipidemia Father   . Prostate cancer Father   . Cancer Maternal Grandmother   . Hyperlipidemia Maternal Grandmother   . Cancer Maternal Grandfather   . Cancer Paternal Grandmother   . Cancer Paternal Grandfather   . Stroke Paternal Grandfather   . Anesthesia problems Neg Hx     Social History   Socioeconomic History  . Marital status: Married    Spouse name: Not on file  . Number of children: 2  . Years of education: Not on file  . Highest education level: Not on file  Occupational History  . Not on file  Tobacco Use  . Smoking status: Current Some Day Smoker    Packs/day: 1.00    Types: Cigarettes    Last attempt to quit: 11/23/2017    Years since quitting: 1.9  . Smokeless tobacco: Never Used  . Tobacco comment: 20  Vaping Use  . Vaping Use: Every day  Substance and Sexual Activity  . Alcohol use: Yes    Comment: rarely  . Drug use: No  . Sexual activity: Yes    Birth control/protection: None  Other Topics Concern  . Not on file  Social History Narrative  . Not on file   Social Determinants of Health   Financial Resource Strain:   . Difficulty of Paying Living Expenses:   Food Insecurity:   . Worried About Programme researcher, broadcasting/film/video in the Last Year:   . Barista in the Last Year:   Transportation Needs:   . Freight forwarder (Medical):   Marland Kitchen Lack of Transportation (Non-Medical):   Physical Activity:   . Days  of Exercise per Week:   . Minutes of Exercise per Session:   Stress:   . Feeling of Stress :   Social Connections:   . Frequency of Communication with Friends and Family:   . Frequency of Social Gatherings with Friends and Family:   . Attends Religious Services:   . Active Member of Clubs or Organizations:   . Attends Banker Meetings:   Marland Kitchen Marital Status:   Intimate Partner Violence:   . Fear of Current or Ex-Partner:   . Emotionally Abused:   Marland Kitchen Physically Abused:   . Sexually Abused:     Outpatient Medications Prior to Visit  Medication Sig Dispense Refill  . clonazePAM (KLONOPIN) 0.5 MG tablet Take 1 tablet (0.5 mg total) by mouth 2 (two) times daily as needed for up to 45 doses for anxiety. 45 tablet 3  . levothyroxine (SYNTHROID) 100 MCG tablet Take 1 tablet (100 mcg total) by mouth daily before breakfast. 45 tablet 3  . calcium carbonate (TUMS) 500 MG chewable tablet Chew 2 tablets (400 mg of elemental calcium total) by mouth 3 (three) times daily. (Patient not taking: Reported on 11/10/2019) 90 tablet 1  . cetirizine (ZYRTEC) 10 MG tablet Take 10 mg by mouth at bedtime. (Patient not taking: Reported on 11/10/2019)    . clobetasol cream (TEMOVATE) 0.05 % Apply 1 application topically 2 (two) times daily. (Patient not taking: Reported on 11/10/2019) 30 g 0  . oxyCODONE (OXY IR/ROXICODONE) 5 MG immediate release tablet Take 1-2 tablets (5-10 mg total) by mouth every 4 (four) hours as needed for moderate pain. (Patient not taking: Reported on 11/10/2019) 15 tablet 0  . propranolol ER (INDERAL LA) 80 MG 24 hr capsule Take 1 capsule (80 mg total) by mouth at bedtime. (Patient not taking: Reported on 11/10/2019) 90 capsule 3  . QUEtiapine (SEROQUEL) 100 MG tablet 1-2 tabs at night (Patient not taking: Reported on 09/24/2019) 60 tablet 0  . Vitamin D, Ergocalciferol, (DRISDOL) 1.25 MG (50000 UNIT) CAPS capsule Take 50,000 Units by mouth once a week. (Patient not taking: Reported on  11/10/2019)     No facility-administered medications prior to visit.    Allergies  Allergen Reactions  . Other Hives and Itching  . Codeine Itching  . Levothyroxine Sodium Hives  . Naproxen Nausea And Vomiting and Rash  . Sulfa Antibiotics Rash    ROS Review of Systems  Constitutional: Negative.   HENT: Negative.   Eyes: Negative.   Respiratory: Negative.   Cardiovascular: Negative.   Gastrointestinal: Negative.   Endocrine: Negative.   Genitourinary: Negative.   Musculoskeletal: Negative.   Skin: Negative.   Allergic/Immunologic: Negative.   Neurological: Negative.   Hematological: Negative.   Psychiatric/Behavioral: Negative.  All other systems reviewed and are negative.     Objective:    Physical Exam Vitals and nursing note reviewed.  Constitutional:      General: She is not in acute distress.    Appearance: Normal appearance. She is normal weight. She is not ill-appearing, toxic-appearing or diaphoretic.  Cardiovascular:     Rate and Rhythm: Normal rate and regular rhythm.  Pulmonary:     Effort: Pulmonary effort is normal. No respiratory distress.  Neurological:     General: No focal deficit present.     Mental Status: She is alert and oriented to person, place, and time. Mental status is at baseline.  Psychiatric:        Mood and Affect: Mood normal.        Behavior: Behavior normal.        Thought Content: Thought content normal.        Judgment: Judgment normal.     BP 125/84   Pulse 98   Temp 98 F (36.7 C) (Temporal)   Resp 18   Ht 5\' 6"  (1.676 m)   Wt 150 lb (68 kg)   SpO2 99%   BMI 24.21 kg/m  Wt Readings from Last 3 Encounters:  11/10/19 150 lb (68 kg)  11/02/19 151 lb (68.5 kg)  10/02/19 148 lb 9 oz (67.4 kg)     Health Maintenance Due  Topic Date Due  . PAP SMEAR-Modifier  07/04/2019    There are no preventive care reminders to display for this patient.  Lab Results  Component Value Date   TSH <0.01 (L) 08/18/2019    Lab Results  Component Value Date   WBC 6.4 09/29/2019   HGB 12.4 09/29/2019   HCT 37.9 09/29/2019   MCV 85.2 09/29/2019   PLT 272 09/29/2019   Lab Results  Component Value Date   NA 137 10/03/2019   K 3.7 10/03/2019   CO2 26 10/03/2019   GLUCOSE 132 (H) 10/03/2019   BUN 11 10/03/2019   CREATININE 0.43 (L) 10/03/2019   BILITOT 0.4 04/29/2019   ALKPHOS 90 04/29/2019   AST 24 04/29/2019   ALT 26 04/29/2019   PROT 6.7 04/29/2019   ALBUMIN 4.1 04/29/2019   CALCIUM 8.3 (L) 10/03/2019   ANIONGAP 10 10/03/2019   No results found for: CHOL No results found for: HDL No results found for: LDLCALC No results found for: TRIG No results found for: CHOLHDL No results found for: 12/03/2019    Assessment & Plan:   Problem List Items Addressed This Visit    None    Visit Diagnoses    Abnormal chest x-ray    -  Primary   Relevant Orders   CT CHEST NODULE FOLLOW UP LOW DOSE W/O      No orders of the defined types were placed in this encounter.   Follow-up: No follow-ups on file.   PLAN  Visit to est care - reviewed histories and updated as appropriate  Order CT per radiology recommendations  Will follow up after imaging  Patient encouraged to call clinic with any questions, comments, or concerns.   [id number], NP

## 2019-11-10 NOTE — Patient Instructions (Signed)
° ° ° °  If you have lab work done today you will be contacted with your lab results within the next 2 weeks.  If you have not heard from us then please contact us. The fastest way to get your results is to register for My Chart. ° ° °IF you received an x-ray today, you will receive an invoice from [facility]. Please contact [facility] at [phone] with questions or concerns regarding your invoice.  ° °IF you received labwork today, you will receive an invoice from [facility]. Please contact [facility] at [phone] with questions or concerns regarding your invoice.  ° °Our billing staff will not be able to assist you with questions regarding bills from these companies. ° °You will be contacted with the lab results as soon as they are available. The fastest way to get your results is to activate your My Chart account. Instructions are located on the last page of this paperwork. If you have not heard from us regarding the results in 2 weeks, please contact this office. °  ° ° ° °

## 2019-11-11 ENCOUNTER — Other Ambulatory Visit (INDEPENDENT_AMBULATORY_CARE_PROVIDER_SITE_OTHER): Payer: 59

## 2019-11-11 DIAGNOSIS — E890 Postprocedural hypothyroidism: Secondary | ICD-10-CM | POA: Diagnosis not present

## 2019-11-11 LAB — TSH: TSH: 23.37 u[IU]/mL — ABNORMAL HIGH (ref 0.35–4.50)

## 2019-11-11 LAB — CALCIUM: Calcium: 9.4 mg/dL (ref 8.4–10.5)

## 2019-11-11 LAB — T4, FREE: Free T4: 1.07 ng/dL (ref 0.60–1.60)

## 2019-11-12 MED ORDER — LEVOTHYROXINE SODIUM 125 MCG PO TABS
125.0000 ug | ORAL_TABLET | Freq: Every day | ORAL | 3 refills | Status: DC
Start: 2019-11-12 — End: 2020-04-11

## 2019-11-12 NOTE — Addendum Note (Signed)
Addended by: Carlus Pavlov on: 11/12/2019 12:59 PM   Modules accepted: Orders

## 2019-11-13 ENCOUNTER — Other Ambulatory Visit: Payer: Self-pay | Admitting: Registered Nurse

## 2019-11-13 DIAGNOSIS — R9389 Abnormal findings on diagnostic imaging of other specified body structures: Secondary | ICD-10-CM

## 2019-11-25 ENCOUNTER — Ambulatory Visit
Admission: RE | Admit: 2019-11-25 | Discharge: 2019-11-25 | Disposition: A | Discharge status: Home / Self Care | Payer: 59 | Source: Ambulatory Visit | Attending: Registered Nurse | Admitting: Registered Nurse

## 2019-11-25 DIAGNOSIS — R9389 Abnormal findings on diagnostic imaging of other specified body structures: Secondary | ICD-10-CM

## 2019-11-26 ENCOUNTER — Encounter: Payer: Self-pay | Admitting: Registered Nurse

## 2020-03-08 ENCOUNTER — Ambulatory Visit: Payer: 59 | Admitting: Internal Medicine

## 2020-04-10 ENCOUNTER — Other Ambulatory Visit: Payer: Self-pay | Admitting: Internal Medicine

## 2020-04-11 ENCOUNTER — Encounter: Payer: Self-pay | Admitting: Internal Medicine

## 2020-04-11 ENCOUNTER — Encounter: Payer: Self-pay | Admitting: Registered Nurse

## 2020-04-11 DIAGNOSIS — E890 Postprocedural hypothyroidism: Secondary | ICD-10-CM

## 2020-04-11 MED ORDER — LEVOTHYROXINE SODIUM 125 MCG PO TABS: 125.0000 ug | ORAL_TABLET | Freq: Every day | ORAL | 1 refills | Status: DC

## 2020-04-11 MED ORDER — CLONAZEPAM 0.5 MG PO TABS: 0.5000 mg | ORAL_TABLET | Freq: Two times a day (BID) | ORAL | 3 refills | Status: DC | PRN

## 2020-04-11 NOTE — Telephone Encounter (Signed)
Patient is requesting a refill of the following medications: Requested Prescriptions   Pending Prescriptions Disp Refills   clonazePAM (KLONOPIN) 0.5 MG tablet 45 tablet 3    Sig: Take 1 tablet (0.5 mg total) by mouth 2 (two) times daily as needed for up to 45 doses for anxiety.    Date of patient request: 04/11/2020 Last office visit: 11/10/2019 Date of last refill: 08/31/2019 Last refill amount: 45 x3refills

## 2020-04-11 NOTE — Telephone Encounter (Signed)
Rx for Levothyroxine sent to preferred pharmacy.

## 2020-04-19 ENCOUNTER — Telehealth: Payer: Self-pay | Admitting: *Deleted

## 2020-04-19 ENCOUNTER — Encounter: Payer: Self-pay | Admitting: *Deleted

## 2020-04-19 DIAGNOSIS — O4400 Placenta previa specified as without hemorrhage, unspecified trimester: Secondary | ICD-10-CM | POA: Insufficient documentation

## 2020-04-19 DIAGNOSIS — Z862 Personal history of diseases of the blood and blood-forming organs and certain disorders involving the immune mechanism: Secondary | ICD-10-CM | POA: Insufficient documentation

## 2020-04-19 NOTE — Telephone Encounter (Signed)
Noted reviewed RN Rolly Salter note agree with plan of care per CDC guidelines.

## 2020-04-19 NOTE — Telephone Encounter (Signed)
RN notified that pt had called out of work today reporting being sick. RN spoke to pt over phone. She reports subjective fever/chills, body aches, slight cough, HA. Denies n/v/d, loss of taste/smell, ShOB. She woke up with all sx yesterday 04/18/20.   Taking [employer] HA powder at home currently. Reviewed additional home management including no additional NSAIDs, adding Tylenol, Dayquil if sinus pain/pressure starts, guaifenesin if chest congestion starts, stay well hydrated. She already scheduled her Covid test for Thursday AM at [facility] through [facility]. Pt is unvaccinated so full 10 day quarantine at this time.   Last day on-site: 04/15/20 Day 1 of Sx: 04/18/20 Day 1 of quarantine: 04/18/20 Day 10 quarantine: 04/28/19 RTW date: 04/29/19

## 2020-04-21 ENCOUNTER — Other Ambulatory Visit: Payer: Self-pay

## 2020-04-21 NOTE — Telephone Encounter (Signed)
Called pt for sx check in. No answer. LVMRCB. Covid test was scheduled for this morning.

## 2020-04-21 NOTE — Telephone Encounter (Signed)
Noted reviewed RN Rolly Salter note test results pending

## 2020-04-22 NOTE — Telephone Encounter (Signed)
Reached pt by phone. She is starting to feel better. Cough still present but feels it is improving. Went for testing 12/29 and received negative covid test results today.  Reviewed updated quarantine dates per new CDC recommendations with pt. With negative test and improving sx, she will complete Day 5 quarantine today and have strict mask use for additional 5 days including eating in vehicle or outside, not in main lunch room thru 04/27/20. She is agreeable to this. Denies further questions/concerns. Plans to be at work [employer] 04/25/20.

## 2020-04-22 NOTE — Telephone Encounter (Signed)
Attempted to contact pt to discuss sx, confirm testing occurred yesterday and discuss RTW date. No answer. LVMRCB.   With new updated CDC recommendations, pt's quarantine dates changing.   Last day on-site: 04/15/20 Day 1 of Sx: 04/18/20 Day 1 of quarantine: 04/18/20 Testing between sx days 3-5 Day 5 quarantine: 04/22/20 If test is negative, RTW date: 1/1 (or next scheduled date 04/25/20). With strict mask use for 5 additional days If positive, will quarantine 5 days from positive test date then strict mask use for 5 additional days.

## 2020-04-24 NOTE — Telephone Encounter (Signed)
Telephone message left for patient calling for symptom follow up to make sure continuing to improve and no fever/chills/vomiting/diarrhea today as plans to return to work tomorrow.  Must wear mask when around others, socially distance 6 week and not eat in main lunch room through 27 Apr 2020.  RN Rolly Salter in clinic tomorrow and I can be reached at [email or web address]

## 2020-04-25 NOTE — Telephone Encounter (Signed)
Attempted to contact pt by phone to confirm RTW. No answer. LVMRCB.

## 2020-04-25 NOTE — Telephone Encounter (Signed)
Pt returned call. Left RN VM that she does not work on Mondays but will be RTW tomorrow as planned. No further needs or concerns. Closing encounter.

## 2020-04-26 NOTE — Telephone Encounter (Signed)
Noted negative covid test agree with plan of care reviewed RN Rolly Salter note patient symptoms improving patient plans to be at work 04/26/2020

## 2020-04-27 ENCOUNTER — Other Ambulatory Visit (INDEPENDENT_AMBULATORY_CARE_PROVIDER_SITE_OTHER): Payer: PRIVATE HEALTH INSURANCE

## 2020-04-27 ENCOUNTER — Other Ambulatory Visit: Payer: Self-pay

## 2020-04-27 DIAGNOSIS — E890 Postprocedural hypothyroidism: Secondary | ICD-10-CM | POA: Diagnosis not present

## 2020-04-27 LAB — TSH: TSH: 3.37 u[IU]/mL (ref 0.35–4.50)

## 2020-04-27 LAB — T4, FREE: Free T4: 1.04 ng/dL (ref 0.60–1.60)

## 2020-06-22 ENCOUNTER — Telehealth: Payer: Self-pay | Admitting: Registered Nurse

## 2020-06-23 ENCOUNTER — Encounter: Payer: Self-pay | Admitting: Registered Nurse

## 2020-06-23 NOTE — Telephone Encounter (Signed)
Patient returned call had nausea and feeling hot at work yesterday then vomiting x 2 and felt way better.  All symptoms resolved.  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  Son had same symptoms came home vomited twice and then all symptoms resolved.  They both ate same thing burrito at [employer] on 06/21/2020.  She thinks was food poisoning.   Patient reported feeling well today denied fever/chills/nausea/vomiting/diarrhea/cough/sore throat/runny nose.  Patient notified if symptoms reoccur to stay home and contact clinic staff.  Cleared to return onsite tomorrow 06/23/2020  HR notified.  Patient verbalized understanding information/instructions, agreed with plan of care and had no further questions at this time.  Patient A&Ox3 spoke full sentences without difficulty; respirations even and unlabored no cough/nasal sniffing/congestion or throat clearing noted during 5 minute telephone call.

## 2020-07-07 ENCOUNTER — Telehealth: Payer: Self-pay | Admitting: Registered Nurse

## 2020-07-07 NOTE — Telephone Encounter (Signed)
Patient called to request refill of    QUEtiapine (SEROQUEL) 100 MG tablet [[id number]] DISCONTINUED   Pharmacy:  [facility] # [address], [place] - [address]  [address] [place] [address]  Phone:  [phone] Fax:  [phone]   Patient has 1 week of medication left and wants to avoid a lapse in doses.  Please advise at [phone].

## 2020-07-07 NOTE — Telephone Encounter (Signed)
Pt is requesting a refill on Seroquel. Medication was discontinued on 04/19/2020 by a Albina Billet NP reason unknown. Ms.Byrd was the last provider to send in this prescription. Please advise.

## 2020-07-08 ENCOUNTER — Other Ambulatory Visit: Payer: Self-pay | Admitting: Registered Nurse

## 2020-07-08 DIAGNOSIS — F411 Generalized anxiety disorder: Secondary | ICD-10-CM

## 2020-07-08 DIAGNOSIS — G4709 Other insomnia: Secondary | ICD-10-CM

## 2020-07-08 MED ORDER — QUETIAPINE FUMARATE 300 MG PO TABS: 300.0000 mg | ORAL_TABLET | Freq: Every day | ORAL | 0 refills | Status: DC

## 2020-07-08 NOTE — Telephone Encounter (Signed)
Have sent refill  Thanks,  Luan Pulling

## 2020-10-06 ENCOUNTER — Other Ambulatory Visit: Payer: Self-pay | Admitting: Internal Medicine

## 2020-10-06 DIAGNOSIS — E890 Postprocedural hypothyroidism: Secondary | ICD-10-CM

## 2020-10-25 ENCOUNTER — Encounter: Payer: Self-pay | Admitting: *Deleted

## 2020-10-25 ENCOUNTER — Telehealth: Payer: Self-pay | Admitting: *Deleted

## 2020-10-25 NOTE — Telephone Encounter (Signed)
Reviewed RN Haley note and agreed with plan of care. 

## 2020-10-25 NOTE — Telephone Encounter (Signed)
Clinic notified that pt called out of work today sick and stating pending covid test results.  Spoke with pt by phone. She reports doing a home test out od precaution due to the nausea but it was negative. She reports Hx of vertigo and states this causing her sx. She has been off balance, dizzy, room-spinning sensation, nausea, no vomiting. Has restarted meds today that she typically uses for vertigo and expects to be back at work tomorrow. HR notified personal medical.

## 2020-10-27 NOTE — Telephone Encounter (Signed)
Pt already had negative testing. Did not answer call today. Supervisor did report pt did RTW yesterday 7/6 as expected. Closing encounter.

## 2020-10-27 NOTE — Telephone Encounter (Signed)
Noted patient returned to work 10/26/20 and negative test results

## 2020-10-30 NOTE — Telephone Encounter (Signed)
Attempted to reach patient via telephone today no answer no voicemail.

## 2020-11-13 NOTE — Telephone Encounter (Signed)
Attempted to reach patient via telephone no answer no voicemail. 

## 2020-12-01 ENCOUNTER — Encounter: Payer: Self-pay | Admitting: Registered Nurse

## 2020-12-04 NOTE — Telephone Encounter (Signed)
Attempted check in with patient via telephone to ensure all symptoms resolved.  Left message for patient to contact me at [phone]

## 2020-12-05 ENCOUNTER — Other Ambulatory Visit: Payer: Self-pay | Admitting: Internal Medicine

## 2020-12-05 DIAGNOSIS — E890 Postprocedural hypothyroidism: Secondary | ICD-10-CM

## 2020-12-20 ENCOUNTER — Ambulatory Visit: Payer: No Typology Code available for payment source | Admitting: Registered Nurse

## 2020-12-28 ENCOUNTER — Other Ambulatory Visit: Payer: Self-pay

## 2020-12-28 ENCOUNTER — Ambulatory Visit (INDEPENDENT_AMBULATORY_CARE_PROVIDER_SITE_OTHER): Payer: No Typology Code available for payment source | Admitting: Internal Medicine

## 2020-12-28 ENCOUNTER — Encounter: Payer: Self-pay | Admitting: Internal Medicine

## 2020-12-28 VITALS — BP 140/90 | HR 98 | Ht 66.0 in | Wt 149.4 lb

## 2020-12-28 DIAGNOSIS — Z8639 Personal history of other endocrine, nutritional and metabolic disease: Secondary | ICD-10-CM | POA: Diagnosis not present

## 2020-12-28 DIAGNOSIS — E890 Postprocedural hypothyroidism: Secondary | ICD-10-CM | POA: Diagnosis not present

## 2020-12-28 LAB — TSH: TSH: 19.32 u[IU]/mL — ABNORMAL HIGH (ref 0.35–5.50)

## 2020-12-28 LAB — T4, FREE: Free T4: 0.93 ng/dL (ref 0.60–1.60)

## 2020-12-28 NOTE — Patient Instructions (Addendum)
  Please continue levothyroxine 125 mcg daily.  Take the thyroid hormone every day, with water, at least 30 minutes before breakfast, separated by at least 4 hours from: - acid reflux medications - calcium - iron - multivitamins  Please stop at the lab.  Come back for another visit in 1 year.

## 2020-12-28 NOTE — Progress Notes (Signed)
Patient ID: [name], female   DOB: Apr 28, 1985, 35 y.o.   MRN: [id number]   This visit occurred during the SARS-CoV-2 public health emergency.  Safety protocols were in place, including screening questions prior to the visit, additional usage of staff PPE, and extensive cleaning of exam room while observing appropriate contact time as indicated for disinfecting solutions.   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  Last visit 1 year and 2 months ago.  Interim history: Before last visit, she quit her old job at [employer] and was planning to start a new job, starting later in the day.  However, she now needs to be at work at 5 am -constantly going up and down ladders. She feels very tired. Also has dizziness + room spinning in the last 2 mo. Has some double vision occasionally. Also watery eyes.  She also has some cold intolerance and sweating with minimal effort.  Reviewed history: Patient describes that for approximately a year (2020), she has been feeling more hyperactive, lost almost 30 lbs, has heat intolerance, tremors, itching, SOB, palpitations.  These symptoms worsened fall 2020.  She finally presented to see her PCP on 04/29/2019 and thyroid tests showed thyrotoxicosis.  She was immediately referred to endocrinology.  On 05/11/2019, we diagnosed Graves' disease based on elevated TSI antibodies and started her on methimazole 10 mg bid.  However, she developed a rash with methimazole and we had to switch to PTU 50 mg tid in 06/2019.  Since last visit, she had total thyroidectomy by Brandy Hansen on 10/02/2019.  She was much better after the surgery with only some fatigue.  Calcium was normal at last check so we stopped her Tums: Lab Results  Component Value Date   CALCIUM 9.4 11/11/2019    We only started levothyroxine on  10/16/2019.  She continues on 125 mcg daily: - in am - fasting - at least 30 min from b'fast -  no Fe, calcium, MVI, PPIs - not on Biotin  I reviewed her TFTs -latest TSH was from before her surgery: Lab Results  Component Value Date   TSH 3.37 04/27/2020   TSH 23.37 (H) 11/11/2019   TSH <0.01 (L) 08/18/2019   TSH <0.01 (L) 07/14/2019   TSH <0.01 (L) 06/09/2019   TSH <0.01 Repeated and verified X2. (L) 05/11/2019   TSH <0.005 (L) 04/29/2019   FREET4 1.04 04/27/2020   FREET4 1.07 11/11/2019   FREET4 2.01 (H) 08/18/2019   FREET4 0.92 07/14/2019   FREET4 1.60 06/09/2019   FREET4 3.83 (H) 05/11/2019   T3FREE 4.7 (H) 08/18/2019   T3FREE 3.9 07/14/2019   T3FREE 5.2 (H) 06/09/2019   T3FREE 13.0 (H) 05/11/2019   Component     Latest Ref Rng & Units 04/29/2019  TSH     0.450 - 4.500 uIU/mL <0.005 (L)  Thyroxine (T4)     4.5 - 12.0 ug/dL >01.7 (HH)  T3 Uptake Ratio     24 - 39 % 56 (H)  Free Thyroxine Index     1.2 - 4.9 >13.9 (H)   Her antithyroid antibodies were elevated:: Lab Results  Component Value Date   TSI 145 (H) 05/11/2019  04/29/2019: ATA antibodies <1, TPO antibodies 355 (0-34)  While hyperthyroid, she had: Fatigue, insomnia (chronic-on Seroquel), heat intolerance, tremors, anxiety, palpitations, hyper defecation, weight loss, easy bruising.  Pt does not have a  FH of thyroid ds. No FH of thyroid cancer. No h/o radiation tx to head or neck.  No herbal supplements. No Biotin use. No recent steroids use.   Mother with DM1.  She also has a history of anemia with Hb of 6.6!  Last pregnancy 07/2018.  She has 2 children.  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  ROS: + See HPI  I reviewed pt's medications, allergies, PMH, social hx, family hx, and changes were documented in the history of present illness. Otherwise, unchanged from my initial visit note.  Past Medical History:  Diagnosis Date    Anemia    Anxiety    Arthritis    knees   Bipolar 1 disorder (HCC)    Chronic kidney disease    ? problems as child, bladder"stretched" fine since   Depression    Fast heart beat    Gestational diabetes    Gestational   HPV (human papilloma virus) anogenital infection    Hypertension    Ovarian cyst    PONV (postoperative nausea and vomiting)    SVD (spontaneous vaginal delivery) 07/24/2018   Past Surgical History:  Procedure Laterality Date   CHONDROPLASTY Right 10/27/2014   Procedure: CHONDROPLASTY;  Surgeon: Brandy Geralds, MD;  Location: [facility];  Service: Orthopedics;  Laterality: Right;   DILATION AND EVACUATION N/A 08/20/2016   Procedure: DILATATION AND EVACUATION;  Surgeon: Brandy Hart, MD;  Location: [facility];  Service: Gynecology;  Laterality: N/A;   KNEE ARTHROSCOPY Right 10/27/2014   Procedure: ARTHROSCOPY KNEE with chondroplasty of medial femeral condyle and femoral patella joint;  Surgeon: Brandy Geralds, MD;  Location: [facility];  Service: Orthopedics;  Laterality: Right;   LAPAROSCOPY  06/24/2011   Procedure: LAPAROSCOPY OPERATIVE;  Surgeon: Brandy Dukes, MD;  Location: [facility];  Service: Gynecology;  Laterality: Right;  operative laparoscopy and right salpingectomy with removal of ectopic pregnancy   THYROIDECTOMY N/A 10/02/2019   Procedure: TOTAL THYROIDECTOMY;  Surgeon: Brandy Level, MD;  Location: [facility];  Service: General;  Laterality: N/A;   WRIST SURGERY     Social History   Socioeconomic History   Marital status: Married    Spouse name: Not on file   Number of children: 2: 9 years and 9 months in 04/2019   Years of education: Not on file   Highest education Hansen: Not on file  Occupational History    Varies diet [  Tobacco Use   Smoking status: Former Smoker, now vapes    Types: Cigarettes    Quit date: 11/23/2017    Years since quitting: 1.4   Smokeless tobacco: Never Used  Substance and Sexual Activity   Alcohol use:      Comment: rarely, beer   Drug use: No   Social Determinants of Corporate investment banker Strain:    Difficulty of Paying Living Expenses: Not on file  Food Insecurity:    Worried About Programme researcher, broadcasting/film/video in the Last Year: Not on file   [employer] of Food in the Last Year: Not on file  Transportation Needs:    Lack of Transportation (Medical): Not on file   Lack of Transportation (Non-Medical): Not on file  Physical Activity:    Days of Exercise per Week: Not on file   Minutes of Exercise per Session: Not on file  Stress:    Feeling of Stress : Not on file  Social Connections:  Frequency of Communication with Hansen and Family: Not on file   Frequency of Social Gatherings with Hansen and Family: Not on file   Attends Religious Services: Not on file   Active Member of Clubs or Organizations: Not on file   Attends Banker Meetings: Not on file   Marital Status: Not on file  Intimate Partner Violence:    Fear of Current or Ex-Partner: Not on file   Emotionally Abused: Not on file   Physically Abused: Not on file   Sexually Abused: Not on file   Current Outpatient Medications on File Prior to Visit  Medication Sig Dispense Refill   clonazePAM (KLONOPIN) 0.5 MG tablet Take 1 tablet (0.5 mg total) by mouth 2 (two) times daily as needed for up to 45 doses for anxiety. 45 tablet 3   etonogestrel-ethinyl estradiol (NUVARING) 0.12-0.015 MG/24HR vaginal ring Place 1 each vaginally every 30 (thirty) days.     levothyroxine (SYNTHROID) 125 MCG tablet Take 1 tablet (125 mcg total) by mouth daily before breakfast. **NO FURTHER REFILLS UNTIL APPT 30 tablet 0   QUEtiapine (SEROQUEL) 300 MG tablet Take 1 tablet (300 mg total) by mouth at bedtime. 90 tablet 0   No current facility-administered medications on file prior to visit.   Allergies  Allergen Reactions   Other Hives and Itching   Codeine Itching   Naproxen Nausea And Vomiting and Rash   Sulfa Antibiotics Rash    Family History  Problem Relation Age of Onset   Diabetes Mother    Breast cancer Mother 59   Hypertension Father    Hyperlipidemia Father    Prostate cancer Father    Cancer Maternal Grandmother    Hyperlipidemia Maternal Grandmother    Cancer Maternal Grandfather    Cancer Paternal Grandmother    Cancer Paternal Grandfather    Stroke Paternal Grandfather    Anesthesia problems Neg Hx    PE: BP 140/90 (BP Location: Right Arm, Patient Position: Sitting, Cuff Size: Normal)   Pulse 98   Ht 5\' 6"  (1.676 m)   Wt 149 lb 6.4 oz (67.8 kg)   SpO2 99%   BMI 24.11 kg/m  Wt Readings from Last 3 Encounters:  12/28/20 149 lb 6.4 oz (67.8 kg)  11/10/19 150 lb (68 kg)  11/02/19 151 lb (68.5 kg)   Constitutional: Normal weight, in NAD Eyes: PERRLA, EOMI, no exophthalmos ENT: moist mucous membranes, no neck masses, thyroidectomy scar healed; no cervical lymphadenopathy Cardiovascular: Tachycardia, RR, No MRG Respiratory: CTA B Gastrointestinal: abdomen soft, NT, ND, BS+ Musculoskeletal: no deformities, strength intact in all 4 Skin: moist, warm, no rashes Neurological: no tremor with outstretched hands, DTR normal in all 4  ASSESSMENT: 1.  Postsurgical hypothyroidism  2. S/p total thyroidectomy  3.  History of Graves' disease  PLAN:  1. Patient with history of uncontrolled Graves' disease, with intolerance to methimazole due to rash and now status post thyroidectomy.  She felt much better after the surgery. - latest thyroid labs reviewed with pt. >> normal: Lab Results  Component Value Date   TSH 3.37 04/27/2020  - she continues on LT4 125 mcg daily - pt feels good on this dose but she recently developed more fatigue (she feels that this is related to starting her day very early  and being very active at work) - we discussed about taking the thyroid hormone every day, with water, >30 minutes before breakfast, separated by >4 hours from acid reflux medications, calcium, iron,  multivitamins. Pt.  is taking it correctly. - will check thyroid tests today: TSH and fT4 - If labs are abnormal, she will need to return for repeat TFTs in 1.5 months  2. S/p total thyroidectomy -Her thyroidectomy scar has healed with very good aesthetic results.  No swelling, erythema, keloid or pain at the incision site. -At last visit, she was taking 2 Tums at night and we stopped these as her calcium levels returned to normal. -She is aware of symptoms of hypocalcemia but did not have them since last visit  3.  History of Graves' disease -She denies signs of Graves' ophthalmopathy except she does mention very watery eyes, started more recently -She also mentions double vision which accompanies her episodes of vertigo -She has seen ophthalmology but this was more than a year ago and I advised her to see them again. -At today's visit we will also check her TSI titer again and I advised her to let her ophthalmologist know about this when she goes.  She also has an appointment to look into her vertigo.  Orders Placed This Encounter  Procedures   Thyroid stimulating immunoglobulin   T4, free   TSH   Needs refills.  Component     Latest Ref Rng & Units 12/28/2020  TSH     0.35 - 5.50 uIU/mL 19.32 (H)  T4,Free(Direct)     0.60 - 1.60 ng/dL 1.610.93  TSH is very high.  TSI antibodies are not back yet.  I will advise her to increase the levothyroxine dose to 137 mcg daily and have her back for labs in 1.5 months.  Carlus Pavlovristina Secilia Apps, MD PhD [facility]

## 2020-12-30 MED ORDER — LEVOTHYROXINE SODIUM 137 MCG PO TABS: 137.0000 ug | ORAL_TABLET | Freq: Every day | ORAL | 3 refills | Status: DC

## 2020-12-30 MED ORDER — LEVOTHYROXINE SODIUM 137 MCG PO TABS
137.0000 ug | ORAL_TABLET | Freq: Every day | ORAL | 3 refills | Status: DC
Start: 2020-12-30 — End: 2021-11-17

## 2021-01-02 ENCOUNTER — Other Ambulatory Visit: Payer: Self-pay | Admitting: Internal Medicine

## 2021-01-02 DIAGNOSIS — E890 Postprocedural hypothyroidism: Secondary | ICD-10-CM

## 2021-01-02 LAB — THYROID STIMULATING IMMUNOGLOBULIN: TSI: 178 % baseline — ABNORMAL HIGH (ref <140)

## 2021-01-03 ENCOUNTER — Ambulatory Visit: Payer: No Typology Code available for payment source | Admitting: Registered Nurse

## 2021-01-03 ENCOUNTER — Telehealth: Payer: Self-pay | Admitting: *Deleted

## 2021-01-03 ENCOUNTER — Encounter: Payer: Self-pay | Admitting: *Deleted

## 2021-01-03 DIAGNOSIS — U071 COVID-19: Secondary | ICD-10-CM

## 2021-01-03 NOTE — Telephone Encounter (Signed)
Clinic staff notified by [employer] that pt called out of work today reporting a positive covid test.  Spoke with pt by phone. She reports on Sunday evening 9/11 she developed a HA, nasal congestion, rhinorrhea, dry cough. She has not checked temp but does endorse chills. Her son is only other person in household and he has same sx that started the day before pt.  Pt tested 9/12 home test and came back as positive. Discussed home management of sx. She is already taking Dayquil/Nyquil. Recommended she add in nasal saline.    Day 0 9/11 Day 5 9/16 RTW 9/17, or next scheduled workday of Mon 9/19 as long as sx improving and no fever, v/d within 24 hrs of return. Strict mask use thru Day 10, 9/21.

## 2021-01-03 NOTE — Telephone Encounter (Signed)
Reviewed RN Rolly Salter note agreed with plan of care will follow up with patient tomorrow via telephone.  Per epic covid 19 risk of complication 0 (low).

## 2021-01-05 NOTE — Telephone Encounter (Signed)
Patient returned call feeling a little better still congested, fatigue and rhinitis.  Both children now sick also.  Patient day 6 9/17 but next scheduled work day 9/19.  Nasal saline, flonase, dayquil/nyquil helping symptoms.  Doesn't feel like sinus infection/worsening cough needing appt or Rx.  Denied fever/chills/vomiting/diarrhea in the previous 24 hours.  Discussed to rest/hydrate continue OTC medication.  Contact me at [phone] if new or worsening symptoms as clinic closed until 9/19 RN [place] on vacation.  Patient A&Ox3 spoke full sentences without difficulty no cough/throat clearing noted during 4 minute telephone call.  Some nasal congestion audible.  HR notified symptoms improving estimated RTW onsite 9/19.  Patient verbalized understanding information/instructions, agreed with plan of care and had no further questions at this time.

## 2021-01-09 IMAGING — DX DG CHEST 2V
2 series · 2 of 2 positions shown · non-contrast
Comparison: None

CLINICAL DATA: Preoperative chest x-ray no current complaints

EXAM:
CHEST - 2 VIEW

[chest pa]
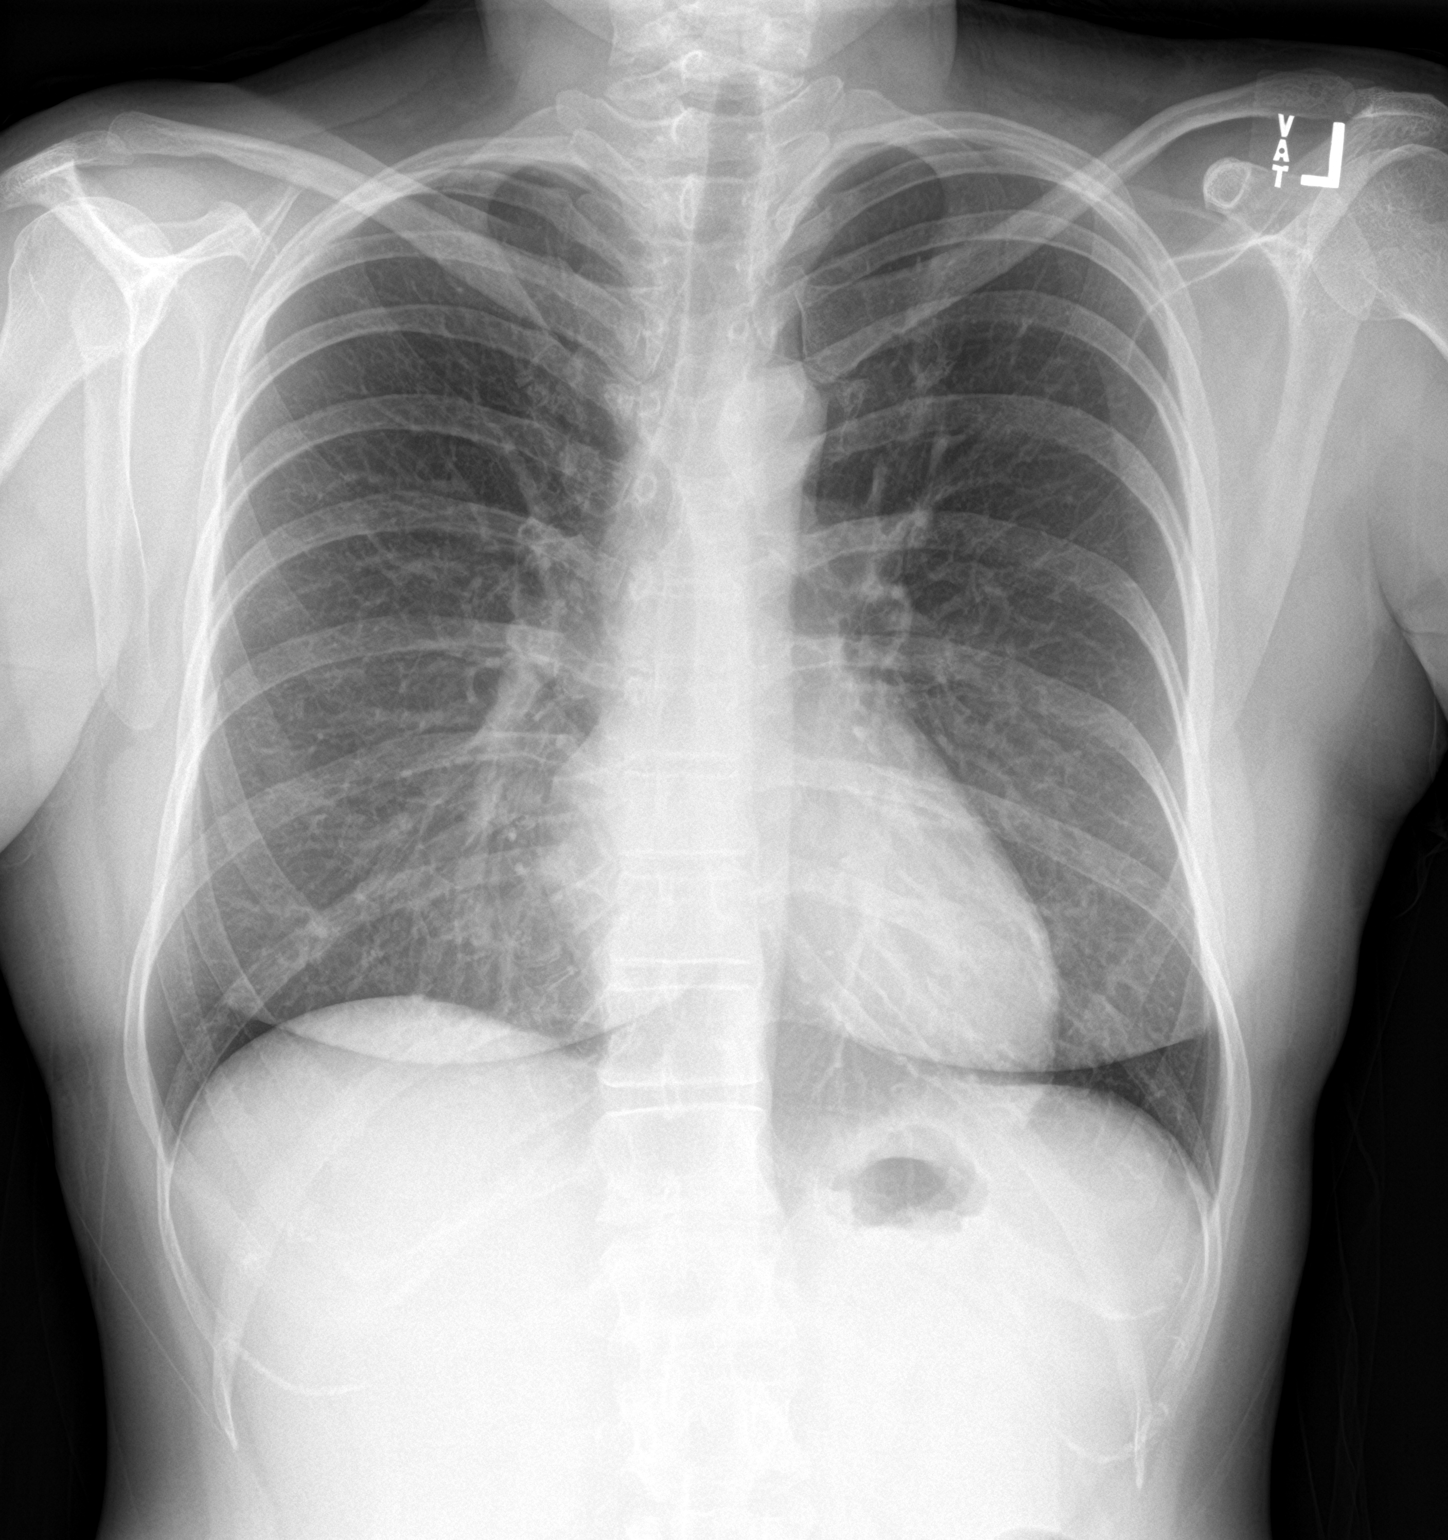

[chest lat]
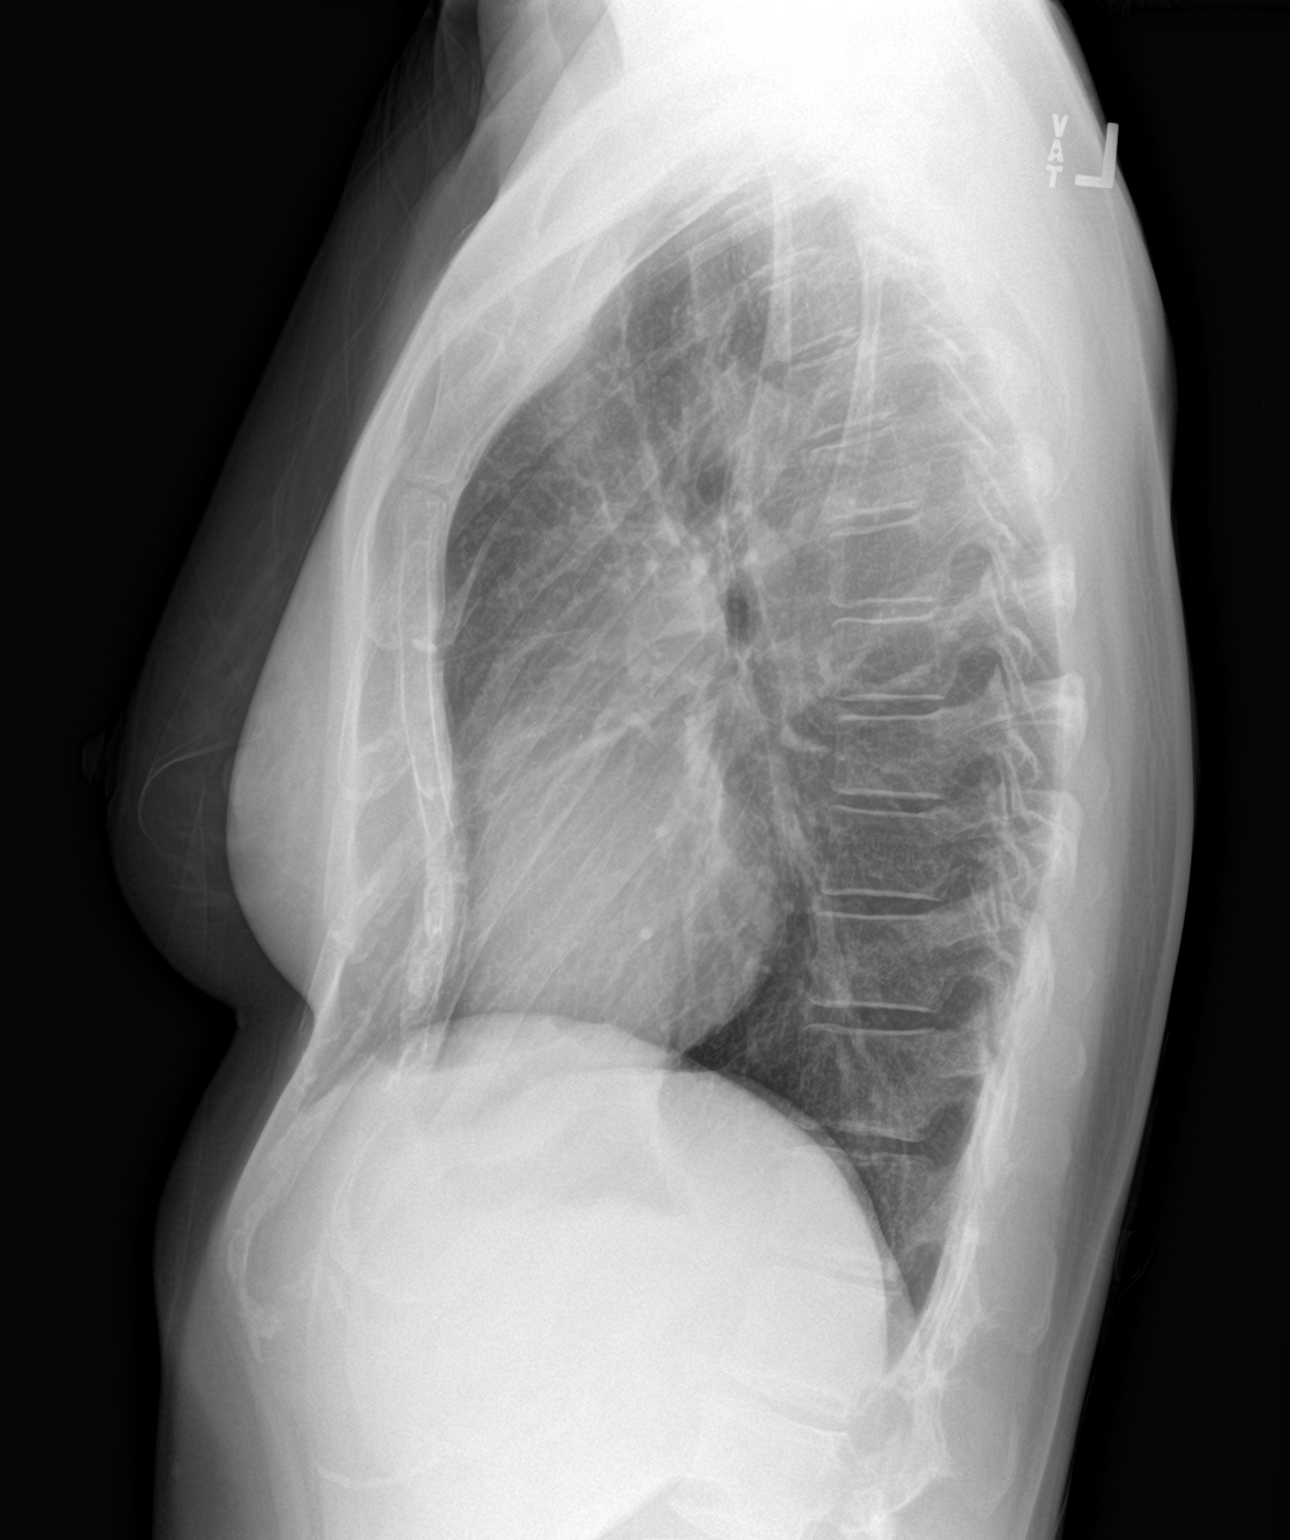

[2 of 2 positions shown; findings below may reference images not displayed]

FINDINGS: Cardiomediastinal contours and hilar structures are normal
accounting for mild pectus excavatum.

Subtle added density in the LEFT upper lobe, asymmetric to the
RIGHT. No dense consolidation. No pleural effusion.

Visualized skeletal structures on limited assessment are
unremarkable.
IMPRESSION: Subtle added density with mildly nodular appearance in the LEFT
upper lobe may represent small nodule or area of parenchymal
scarring. CT of the chest is suggested on a nonemergent basis for
further assessment. Correlate with any recent history of infection.

These results will be called to the ordering clinician or
representative by the Radiologist Assistant, and communication
documented in the PACS or [REDACTED].

## 2021-01-09 NOTE — Telephone Encounter (Signed)
Pt reports she did RTW today as expected. Feeling a little better but still fatigued. Denies needs or concerns. Closing encounter.

## 2021-01-09 NOTE — Telephone Encounter (Signed)
Noted patient returned to work as expected today still having some fatigue.  Day 8 today.

## 2021-01-10 ENCOUNTER — Other Ambulatory Visit: Payer: Self-pay | Admitting: Internal Medicine

## 2021-01-10 ENCOUNTER — Encounter: Payer: Self-pay | Admitting: Internal Medicine

## 2021-01-10 DIAGNOSIS — E890 Postprocedural hypothyroidism: Secondary | ICD-10-CM

## 2021-04-09 NOTE — Telephone Encounter (Signed)
Left message symptom follow up

## 2021-04-30 NOTE — Telephone Encounter (Signed)
Per HR list no longer employed by [employer] encounter closed.

## 2021-11-17 ENCOUNTER — Telehealth: Payer: Self-pay | Admitting: Internal Medicine

## 2021-11-17 DIAGNOSIS — E890 Postprocedural hypothyroidism: Secondary | ICD-10-CM

## 2021-11-17 MED ORDER — LEVOTHYROXINE SODIUM 137 MCG PO TABS: 137.0000 ug | ORAL_TABLET | Freq: Every day | ORAL | 3 refills | Status: DC

## 2021-11-17 NOTE — Telephone Encounter (Signed)
MEDICATION: Levothyroxine  PHARMACY:  [facility]  HAS THE PATIENT CONTACTED THEIR PHARMACY? yes   IS THIS A 90 DAY SUPPLY : yes  IS PATIENT OUT OF MEDICATION: yes  IF NOT; HOW MUCH IS LEFT:   LAST APPOINTMENT DATE: @09 /10/2020   NEXT APPOINTMENT DATE:@9 /13/2023  DO WE HAVE YOUR PERMISSION TO LEAVE A DETAILED MESSAGE?:  OTHER COMMENTS:    **Let patient know to contact pharmacy at the end of the day to make sure medication is ready. **  ** Please notify patient to allow 48-72 hours to process**  **Encourage patient to contact the pharmacy for refills or they can request refills through [facility]**

## 2021-11-17 NOTE — Telephone Encounter (Signed)
RX sent to listed pharmacy

## 2021-12-22 DIAGNOSIS — F17200 Nicotine dependence, unspecified, uncomplicated: Secondary | ICD-10-CM | POA: Diagnosis not present

## 2021-12-22 DIAGNOSIS — R002 Palpitations: Secondary | ICD-10-CM | POA: Diagnosis not present

## 2021-12-22 DIAGNOSIS — R0789 Other chest pain: Secondary | ICD-10-CM | POA: Diagnosis not present

## 2021-12-22 DIAGNOSIS — R0609 Other forms of dyspnea: Secondary | ICD-10-CM | POA: Diagnosis not present

## 2022-01-03 ENCOUNTER — Ambulatory Visit: Payer: BC Managed Care – PPO | Admitting: Internal Medicine

## 2022-01-03 ENCOUNTER — Encounter: Payer: Self-pay | Admitting: Internal Medicine

## 2022-01-03 VITALS — BP 120/82 | HR 95 | Ht 66.0 in | Wt 125.2 lb

## 2022-01-03 DIAGNOSIS — E890 Postprocedural hypothyroidism: Secondary | ICD-10-CM | POA: Diagnosis not present

## 2022-01-03 DIAGNOSIS — Z8639 Personal history of other endocrine, nutritional and metabolic disease: Secondary | ICD-10-CM | POA: Diagnosis not present

## 2022-01-03 LAB — TSH: TSH: 4.99 u[IU]/mL (ref 0.35–5.50)

## 2022-01-03 LAB — T4, FREE: Free T4: 1.43 ng/dL (ref 0.60–1.60)

## 2022-01-03 NOTE — Progress Notes (Signed)
Patient ID: Brandy Hansen, female   DOB: 04-15-1986, 36 y.o.   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  Last visit 1 year ago.  Interim history: She had a very stressful year, going through a divorce and being busy with her children.  Earlier in the summer, she stopped her levothyroxine for 2 months.  She saw cardiology 1.5 months ago and the TSH was very high.  She was feeling poorly, with significant fatigue, chest pain, palpitations, and she also describes weight loss, possibly related to the stress. She is now feeling better after restarting levothyroxine.  Reviewed history: Patient describes that for approximately a year (2020), she has been feeling more hyperactive, lost almost 30 lbs, has heat intolerance, tremors, itching, SOB, palpitations.  These symptoms worsened fall 2020.  She finally presented to see her PCP on 04/29/2019 and thyroid tests showed thyrotoxicosis.  She was immediately referred to endocrinology.  On 05/11/2019, we diagnosed Graves' disease based on elevated TSI antibodies and started her on methimazole 10 mg bid.  However, she developed a rash with methimazole and we had to switch to PTU 50 mg tid in 06/2019.  Since last visit, she had total thyroidectomy by Dr. Gerrit Friends on 10/02/2019.  She was much better after the surgery with only some fatigue.  Calcium was normal >> we stopped her Tums: 11/16/2021: CALCIUM 8.7 - 10.2 mg/dL 9.7   Total Protein 6.0 - 8.5 g/dL 8.1   Albumin, Serum 3.9 - 4.9 g/dL 5.3 High     Lab Results  Component Value Date   CALCIUM 9.4 11/11/2019    We only started levothyroxine on 10/16/2019.  In 12/2020 I advised her to increase the dose from 125 to 137 mcg daily as her TSH was 19.32.  She did not come for repeat TFTs afterwards.   On 11/16/2021, she saw cardiology  for chest pain, palpitations and also mentioned a weight loss of 20 pounds in the previous year.  At that time, TSH was 183.  On 11/17/2021, she called our office requesting a refill of her levothyroxine.  She is currently taking LT4 137 mcg daily: - in am - fasting - at least 30 min from b'fast -  no calcium, MVI, PPIs - + Fe at night - not on Biotin  I reviewed her TFTs: 11/16/2021: TSH 183, fT4 0.45 Lab Results  Component Value Date   TSH 19.32 (H) 12/28/2020   TSH 3.37 04/27/2020   TSH 23.37 (H) 11/11/2019   TSH <0.01 (L) 08/18/2019   TSH <0.01 (L) 07/14/2019   TSH <0.01 (L) 06/09/2019   TSH <0.01 Repeated and verified X2. (L) 05/11/2019   TSH <0.005 (L) 04/29/2019   FREET4 0.93 12/28/2020   FREET4 1.04 04/27/2020   FREET4 1.07 11/11/2019   FREET4 2.01 (H) 08/18/2019   FREET4 0.92 07/14/2019   FREET4 1.60 06/09/2019   FREET4 3.83 (H) 05/11/2019   T3FREE 4.7 (H) 08/18/2019   T3FREE 3.9 07/14/2019   T3FREE 5.2 (H) 06/09/2019   T3FREE 13.0 (H) 05/11/2019   Component     Latest Ref Rng & Units 04/29/2019  TSH     0.450 - 4.500 uIU/mL <0.005 (L)  Thyroxine (T4)     4.5 - 12.0 ug/dL >40.9 (HH)  T3 Uptake Ratio     24 - 39 % 56 (H)  Free Thyroxine Index  1.2 - 4.9 >13.9 (H)   Her antithyroid antibodies were elevated:: Lab Results  Component Value Date   TSI 178 (H) 12/28/2020   TSI 145 (H) 05/11/2019  04/29/2019: ATA antibodies <1, TPO antibodies 355 (0-34)  While hyperthyroid, she had: Fatigue, insomnia (chronic-on Seroquel), heat intolerance, tremors, anxiety, palpitations, hyper defecation, weight loss, easy bruising.  Pt does not have a FH of thyroid ds. No FH of thyroid cancer. No h/o radiation tx to head or neck. No herbal supplements. No Biotin use. No recent steroids use.   Mother with DM1.  She also has a history of anemia with Hb of 6.6!  Last pregnancy 07/2018.  She has 2 children. 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  ROS: + See HPI  I reviewed pt's medications, allergies, PMH, social hx, family hx, and changes were documented in the history of present illness. Otherwise, unchanged from my initial visit note.  Past Medical History:  Diagnosis Date   Anemia    Anxiety    Arthritis    knees   Bipolar 1 disorder (HCC)    Chronic kidney disease    ? problems as child, bladder"stretched" fine since   Depression    Fast heart beat    Gestational diabetes    Gestational   HPV (human papilloma virus) anogenital infection    Hypertension    Ovarian cyst    PONV (postoperative nausea and vomiting)    SVD (spontaneous vaginal delivery) 07/24/2018   Past Surgical History:  Procedure Laterality Date   CHONDROPLASTY Right 10/27/2014   Procedure: CHONDROPLASTY;  Surgeon: Jodi Geralds, MD;  Location: [facility];  Service: Orthopedics;  Laterality: Right;   DILATION AND EVACUATION N/A 08/20/2016   Procedure: DILATATION AND EVACUATION;  Surgeon: Essie Hart, MD;  Location: [facility];  Service: Gynecology;  Laterality: N/A;   KNEE ARTHROSCOPY Right 10/27/2014   Procedure: ARTHROSCOPY KNEE with chondroplasty of medial femeral condyle and femoral patella joint;  Surgeon: Jodi Geralds, MD;  Location: [facility];  Service: Orthopedics;  Laterality: Right;   LAPAROSCOPY  06/24/2011   Procedure: LAPAROSCOPY OPERATIVE;  Surgeon: Lesly Dukes, MD;  Location: [facility];  Service: Gynecology;  Laterality: Right;  operative laparoscopy and right salpingectomy with removal of ectopic pregnancy   THYROIDECTOMY N/A 10/02/2019   Procedure: TOTAL THYROIDECTOMY;  Surgeon: Darnell Level, MD;  Location: [facility];  Service: General;  Laterality: N/A;   WRIST SURGERY     Social History   Socioeconomic History   Marital status: Married    Spouse name: Not on file   Number of children: 2: 9 years and 9 months in 04/2019   Years of education: Not on file   Highest  education level: Not on file  Occupational History    Varies diet [  Tobacco Use   Smoking status: Former Smoker, now vapes    Types: Cigarettes    Quit date: 11/23/2017    Years since quitting: 1.4   Smokeless tobacco: Never Used  Substance and Sexual Activity   Alcohol use:     Comment: rarely, beer   Drug use: No   Social Determinants of Corporate investment banker Strain:    Difficulty of Paying Living Expenses: Not on file  Food Insecurity:    Worried About Programme researcher, broadcasting/film/video in the Last Year: Not on file   [employer] of Food in the Last Year: Not  on file  Transportation Needs:    Lack of Transportation (Medical): Not on file   Lack of Transportation (Non-Medical): Not on file  Physical Activity:    Days of Exercise per Week: Not on file   Minutes of Exercise per Session: Not on file  Stress:    Feeling of Stress : Not on file  Social Connections:    Frequency of Communication with Friends and Family: Not on file   Frequency of Social Gatherings with Friends and Family: Not on file   Attends Religious Services: Not on file   Active Member of Clubs or Organizations: Not on file   Attends Banker Meetings: Not on file   Marital Status: Not on file  Intimate Partner Violence:    Fear of Current or Ex-Partner: Not on file   Emotionally Abused: Not on file   Physically Abused: Not on file   Sexually Abused: Not on file   Current Outpatient Medications on File Prior to Visit  Medication Sig Dispense Refill   clonazePAM (KLONOPIN) 0.5 MG tablet Take 1 tablet (0.5 mg total) by mouth 2 (two) times daily as needed for up to 45 doses for anxiety. (Patient not taking: Reported on 12/28/2020) 45 tablet 3   levothyroxine (SYNTHROID) 137 MCG tablet Take 1 tablet (137 mcg total) by mouth daily before breakfast. 45 tablet 3   QUEtiapine (SEROQUEL) 300 MG tablet Take 1 tablet (300 mg total) by mouth at bedtime. 90 tablet 0   No current facility-administered medications on  file prior to visit.   Allergies  Allergen Reactions   Other Hives and Itching   Codeine Itching   Naproxen Nausea And Vomiting and Rash   Sulfa Antibiotics Rash   Family History  Problem Relation Age of Onset   Diabetes Mother    Breast cancer Mother 58   Hypertension Father    Hyperlipidemia Father    Prostate cancer Father    Cancer Maternal Grandmother    Hyperlipidemia Maternal Grandmother    Cancer Maternal Grandfather    Cancer Paternal Grandmother    Cancer Paternal Grandfather    Stroke Paternal Grandfather    Anesthesia problems Neg Hx    PE: BP 120/82 (BP Location: Left Arm, Patient Position: Sitting, Cuff Size: Normal)   Pulse 95   Ht 5\' 6"  (1.676 m)   Wt 125 lb 3.2 oz (56.8 kg)   SpO2 97%   BMI 20.21 kg/m  Wt Readings from Last 3 Encounters:  01/03/22 125 lb 3.2 oz (56.8 kg)  12/28/20 149 lb 6.4 oz (67.8 kg)  11/10/19 150 lb (68 kg)   Constitutional: Normal weight, in NAD Eyes: EOMI, no exophthalmos ENT: moist mucous membranes, no neck masses, thyroidectomy scar healed; no cervical lymphadenopathy Cardiovascular: Tachycardia, RR, No MRG Respiratory: CTA B Musculoskeletal: no deformities Skin: moist, warm, no rashes Neurological: no tremor with outstretched hands, DTR normal in all 4  ASSESSMENT: 1.  Postsurgical hypothyroidism  2. S/p total thyroidectomy  3.  History of Graves' disease  PLAN:  1. Patient with history of uncontrolled Graves' disease, with intolerance to methimazole due to rash and now status post thyroidectomy.  She did feel better after her surgery. - However, she developed uncontrolled surgical hypothyroidism - At last visit TSH was elevated, at 19.32 and I advised her to increase LT4 dose from 125 to 137 mcg daily - She did not come for repeat TFTs afterwards as advised - A TSH obtained on 11/16/2021 by cardiology was extremely elevated,  at 183.  She was very tired and had chest pain and palpitations. - She contacted our clinic  the day after to request a refill of her LT4 - she continues on LT4 137 mcg daily now as she started to feel much better after starting back on the medication - she lost a significant amount of weight in the last year, possibly due to stress. - Per my scale, she lost 24 pounds since our last visit.  We discussed that this may trigger the need for a lower dose of levothyroxine. - we discussed about taking the thyroid hormone every day, with water, >30 minutes before breakfast, separated by >4 hours from acid reflux medications, calcium, iron, multivitamins. Pt. is taking it correctly now. - will check thyroid tests today: TSH and fT4 - we did discuss that if we need to change the dose she needs to come back in 1.5 months for labs - OTW, I will see her in a month but with labs in 3 months even if the labs above are normal  2. S/p total thyroidectomy -Her thyroidectomy scar has healed with very good aesthetic results.  No swelling, erythema, keloid or pain at the incision site. -Previous on Tums, will restart as calcium normalized -She is aware of symptoms of hypocalcemia >> no numbness, tingling, muscle cramping recently -Latest calcium level was normal in 10/2021.  We will not repeat this today..  3.  History of Graves' disease -No signs of Graves' ophthalmopathy: No double vision, blurry vision, eye pain, chemosis.  She had watery eyes at last visit.  She mentions that this worsens when her TSH was very high but it improved afterwards.  Urine -She saw ophthalmology in the past -TSI's are still elevated at last check >> will recheck today  Needs refills - 90 days.  Component     Latest Ref Rng 01/03/2022  TSH     0.35 - 5.50 uIU/mL 4.99   T4,Free(Direct)     0.60 - 1.60 ng/dL 1.02   TSI     <585 % baseline 137    Thyroid function tests and Graves' antibodies are normal.  For now we will continue the same dose of levothyroxine and repeat the test at next visit.  Carlus Pavlov, MD  PhD [facility] Endocrinology

## 2022-01-03 NOTE — Patient Instructions (Signed)
  Please continue levothyroxine 137 mcg daily.  Take the thyroid hormone every day, with water, at least 30 minutes before breakfast, separated by at least 4 hours from: - acid reflux medications - calcium - iron - multivitamins  Please stop at the lab.  Come back for another visit in 6 months.

## 2022-01-05 LAB — THYROID STIMULATING IMMUNOGLOBULIN: TSI: 137 % baseline (ref <140)

## 2022-01-05 MED ORDER — LEVOTHYROXINE SODIUM 137 MCG PO TABS: 137.0000 ug | ORAL_TABLET | Freq: Every day | ORAL | 3 refills | Status: DC

## 2022-03-26 ENCOUNTER — Encounter (HOSPITAL_BASED_OUTPATIENT_CLINIC_OR_DEPARTMENT_OTHER): Payer: Self-pay

## 2022-03-26 ENCOUNTER — Encounter: Payer: Self-pay | Admitting: Internal Medicine

## 2022-03-26 ENCOUNTER — Emergency Department (HOSPITAL_BASED_OUTPATIENT_CLINIC_OR_DEPARTMENT_OTHER)
Admission: EM | Admit: 2022-03-26 | Discharge: 2022-03-26 | Disposition: A | Discharge status: Home / Self Care | Payer: BC Managed Care – PPO | Attending: Emergency Medicine | Admitting: Emergency Medicine

## 2022-03-26 DIAGNOSIS — D649 Anemia, unspecified: Secondary | ICD-10-CM | POA: Insufficient documentation

## 2022-03-26 DIAGNOSIS — R55 Syncope and collapse: Secondary | ICD-10-CM | POA: Insufficient documentation

## 2022-03-26 DIAGNOSIS — R739 Hyperglycemia, unspecified: Secondary | ICD-10-CM | POA: Insufficient documentation

## 2022-03-26 LAB — URINALYSIS, ROUTINE W REFLEX MICROSCOPIC
Bilirubin Urine: NEGATIVE
Glucose, UA: NEGATIVE mg/dL
Ketones, ur: NEGATIVE mg/dL
Leukocytes,Ua: NEGATIVE
Nitrite: NEGATIVE
Protein, ur: NEGATIVE mg/dL
Specific Gravity, Urine: 1.01 (ref 1.005–1.030)
pH: 7 (ref 5.0–8.0)

## 2022-03-26 LAB — BASIC METABOLIC PANEL
Anion gap: 8 (ref 5–15)
BUN: 8 mg/dL (ref 6–20)
CO2: 24 mmol/L (ref 22–32)
Calcium: 9.5 mg/dL (ref 8.9–10.3)
Chloride: 104 mmol/L (ref 98–111)
Creatinine, Ser: 0.62 mg/dL (ref 0.44–1.00)
GFR, Estimated: >60 mL/min (ref >60)
Glucose, Bld: 121 mg/dL — ABNORMAL HIGH (ref 70–99)
Potassium: 4.1 mmol/L (ref 3.5–5.1)
Sodium: 136 mmol/L (ref 135–145)

## 2022-03-26 LAB — CBC
HCT: 37.2 % (ref 36.0–46.0)
Hemoglobin: 11.9 g/dL — ABNORMAL LOW (ref 12.0–15.0)
MCH: 26.9 pg (ref 26.0–34.0)
MCHC: 32 g/dL (ref 30.0–36.0)
MCV: 84.2 fL (ref 80.0–100.0)
Platelets: 399 10*3/uL (ref 150–400)
RBC: 4.42 MIL/uL (ref 3.87–5.11)
RDW: 14.3 % (ref 11.5–15.5)
WBC: 9.7 10*3/uL (ref 4.0–10.5)
nRBC: 0 % (ref 0.0–0.2)

## 2022-03-26 LAB — PREGNANCY, URINE: Preg Test, Ur: NEGATIVE

## 2022-03-26 LAB — URINALYSIS, MICROSCOPIC (REFLEX)

## 2022-03-26 NOTE — ED Provider Notes (Signed)
[facility] EMERGENCY DEPARTMENT Provider Note   CSN: [id number] Arrival date & time: 03/26/22  1116     History  Chief Complaint  Patient presents with   Loss of Consciousness    Brandy Hansen is a 36 y.o. female.  Patient with history of anemia, history of thyroid surgery --presents to the emergency department for evaluation of syncopal episode.  Patient was in normal state of health last night when she became lightheaded while brushing her teeth.  This was witnessed by a family member.  Patient reported feeling dizzy.  Family member noted that she started to pass out and patient was lowered slowly to the ground.  No preceding chest pain or shortness of breath.  No associated headache.  No strokelike symptoms.  Patient did not exhibit any shaking or seizure-like activity.  She was very pale and afterwards was extremely nauseous.  She recovered but felt "shaky" this morning prompting emergency department visit.  No further episodes of syncope.  No preceding nausea, vomiting, or diarrhea.       Home Medications Prior to Admission medications   Medication Sig Start Date End Date Taking? Authorizing Provider  clonazePAM (KLONOPIN) 0.5 MG tablet Take 1 tablet (0.5 mg total) by mouth 2 (two) times daily as needed for up to 45 doses for anxiety. Patient not taking: Reported on 12/28/2020 04/11/20   Janeece Agee, NP  levothyroxine (SYNTHROID) 137 MCG tablet Take 1 tablet (137 mcg total) by mouth daily before breakfast. 01/05/22   Carlus Pavlov, MD  QUEtiapine (SEROQUEL) 300 MG tablet Take 1 tablet (300 mg total) by mouth at bedtime. 07/08/20   Janeece Agee, NP      Allergies    Other, Codeine, Naproxen, and Sulfa antibiotics    Review of Systems   Review of Systems  Physical Exam Updated Vital Signs BP 131/80 (BP Location: Right Arm)   Pulse 74   Temp 98.4 F (36.9 C) (Oral)   Resp 16   Ht 5\' 7"  (1.702 m)   Wt 56.7 kg   SpO2 100%   BMI 19.58 kg/m    Physical Exam Vitals and nursing note reviewed.  Constitutional:      General: She is not in acute distress.    Appearance: She is well-developed.  HENT:     Head: Normocephalic and atraumatic.     Right Ear: External ear normal.     Left Ear: External ear normal.     Nose: Nose normal.     Mouth/Throat:     Mouth: Mucous membranes are moist.  Eyes:     Conjunctiva/sclera: Conjunctivae normal.  Cardiovascular:     Rate and Rhythm: Normal rate and regular rhythm.     Heart sounds: No murmur heard. Pulmonary:     Effort: No respiratory distress.     Breath sounds: No wheezing, rhonchi or rales.  Abdominal:     Palpations: Abdomen is soft.     Tenderness: There is no abdominal tenderness. There is no guarding or rebound.  Musculoskeletal:     Cervical back: Normal range of motion and neck supple.     Right lower leg: No edema.     Left lower leg: No edema.  Skin:    General: Skin is warm and dry.     Findings: No rash.  Neurological:     General: No focal deficit present.     Mental Status: She is alert. Mental status is at baseline.     Motor: No weakness.  Psychiatric:        Mood and Affect: Mood normal.     ED Results / Procedures / Treatments   Labs (all labs ordered are listed, but only abnormal results are displayed) Labs Reviewed  BASIC METABOLIC PANEL - Abnormal; Notable for the following components:      Result Value   Glucose, Bld 121 (*)    All other components within normal limits  CBC - Abnormal; Notable for the following components:   Hemoglobin 11.9 (*)    All other components within normal limits  URINALYSIS, ROUTINE W REFLEX MICROSCOPIC - Abnormal; Notable for the following components:   Hgb urine dipstick MODERATE (*)    All other components within normal limits  URINALYSIS, MICROSCOPIC (REFLEX) - Abnormal; Notable for the following components:   Bacteria, UA MANY (*)    All other components within normal limits  PREGNANCY, URINE    ED  ECG REPORT   Date: 03/26/2022  Rate: 75  Rhythm: normal sinus rhythm  QRS Axis: normal  Intervals: normal  ST/T Wave abnormalities: normal  Conduction Disutrbances:none  Narrative Interpretation: No sign of Brugada syndrome, QT prolongation, WPW, other arrhythmia, hypertrophy, heart block  Old EKG Reviewed: no significant changes  I have personally reviewed the EKG tracing and agree with the computerized printout as noted.   Radiology No results found.  Procedures Procedures    Medications Ordered in ED Medications - No data to display  ED Course/ Medical Decision Making/ A&P    Patient seen and examined. History obtained directly from patient. Work-up including labs, imaging, EKG ordered in triage, if performed, were reviewed.    Labs/EKG: Independently reviewed and interpreted.  This included: CBC with normal white blood cell count, hemoglobin 11.9; BMP mild hyperglycemia at 121, normal electrolytes including potassium, normal kidney function; pregnancy negative; UA without signs of infection or significant dehydration.  Imaging: None ordered.  No focal neurologic deficits to necessitate head CT.    Medications/Fluids: Ordered: None ordered  Most recent vital signs reviewed and are as follows: BP 113/71   Pulse 77   Temp 98.4 F (36.9 C)   Resp 17   Ht 5\' 7"  (1.702 m)   Wt 56.7 kg   SpO2 99%   BMI 19.58 kg/m   Initial impression: Low risk syncope.  Reassuring workup.  Patient without any associated chest pain or EKG changes.  Low concern for myocarditis or arrhythmia.  Low concern for PE or intracerebral hemorrhage based on lack of suggestive symptoms and exam.  Plan: Orthostatic vitals, fluid challenge. Likely d/c. She looks well. Vitals reassuring.   Orthostatic VS for the past 24 hrs:  BP- Lying Pulse- Lying BP- Sitting Pulse- Sitting BP- Standing at 0 minutes Pulse- Standing at 0 minutes  03/26/22 1713 116/66 71 110/78 77 121/78 86   5:23 PM Reassessment  performed. Patient appears stable.  Orthostatics as above.  Reviewed pertinent lab work and imaging with patient at bedside. Questions answered.   Most current vital signs reviewed and are as follows: BP 120/76   Pulse 71   Temp 98.4 F (36.9 C)   Resp 15   Ht 5\' 7"  (1.702 m)   Wt 56.7 kg   SpO2 100%   BMI 19.58 kg/m   Plan: Discharge to home.   Prescriptions written for: None  Other home care instructions discussed: Rest, good hydration  ED return instructions discussed: Return with severe headache, persistent vomiting, confusion, chest pain, shortness of breath, additional episodes of syncope  Follow-up instructions discussed: Patient encouraged to follow-up with their PCP in 3-5 days.                           Medical Decision Making Amount and/or Complexity of Data Reviewed Labs: ordered.   Patient with syncopal episode last evening with prodrome.  Overall history suggest low risk syncope.  History of anemia, no significant anemia today.  Vital signs are reassuring.  EKG reassuring.  Patient is not pregnant.  No sign of significant infection.  Patient overall appears well, nontoxic.  The patient's vital signs, pertinent lab work and imaging were reviewed and interpreted as discussed in the ED course. Hospitalization was considered for further testing, treatments, or serial exams/observation. However as patient is well-appearing, has a stable exam, and reassuring studies today, I do not feel that they warrant admission at this time. This plan was discussed with the patient who verbalizes agreement and comfort with this plan and seems reliable and able to return to the Emergency Department with worsening or changing symptoms.          Final Clinical Impression(s) / ED Diagnoses Final diagnoses:  Syncope, unspecified syncope type    Rx / DC Orders ED Discharge Orders     None         Renne Crigler, PA-C 03/26/22 1725    Ernie Avena, MD 03/26/22  1905

## 2022-03-26 NOTE — ED Triage Notes (Signed)
Pt c/o witnessed syncopal episode yesterday, feeling "shaky" & "not normal" yesterday/ today. Visitor reports what "looked like a vaso-vagal episode, near-vomiting, just didn't look right." Pt reports hx of anemia, thyroid disorder

## 2022-03-26 NOTE — ED Notes (Signed)
Patient given 2 cups of water, tolerated well. Denies any concerns at present.

## 2022-03-26 NOTE — Discharge Instructions (Signed)
Please read and follow all provided instructions.  Your diagnoses today include:  1. Syncope, unspecified syncope type     Tests performed today include: An EKG of your heart: no signs of heart attack or arrhythmia A chest x-ray Blood counts and electrolytes: No significant anemia or other problems Urine testing: No compelling signs of UTI or pregnancy Vital signs. See below for your results today.   Medications prescribed:  None  Take any prescribed medications only as directed.  Follow-up instructions: Please follow-up with your doctor in the next 3 days for recheck.  Return instructions:  SEEK IMMEDIATE MEDICAL ATTENTION IF: You have severe chest pain, especially if the pain is crushing or pressure-like and spreads to the arms, back, neck, or jaw, or if you have sweating, nausea or vomiting, or trouble with breathing. THIS IS AN EMERGENCY. Do not wait to see if the pain will go away. Get medical help at once. Call 911. DO NOT drive yourself to the hospital.  You have significant dizziness, if you have additional episodes of passing out, or have trouble walking.  You have chest pain not typical of your usual pain for which you originally saw your caregiver.  You have any other emergent concerns regarding your health.   Your vital signs today were: BP 113/71   Pulse 77   Temp 98.4 F (36.9 C)   Resp 17   Ht 5\' 7"  (1.702 m)   Wt 56.7 kg   SpO2 99%   BMI 19.58 kg/m  If your blood pressure (BP) was elevated above 135/85 this visit, please have this repeated by your doctor within one month. --------------

## 2022-07-06 ENCOUNTER — Ambulatory Visit (INDEPENDENT_AMBULATORY_CARE_PROVIDER_SITE_OTHER): Payer: Self-pay | Admitting: Internal Medicine

## 2022-07-06 ENCOUNTER — Encounter: Payer: Self-pay | Admitting: Internal Medicine

## 2022-07-06 VITALS — BP 118/76 | HR 114 | Ht 67.0 in | Wt 126.4 lb

## 2022-07-06 DIAGNOSIS — E890 Postprocedural hypothyroidism: Secondary | ICD-10-CM

## 2022-07-06 DIAGNOSIS — Z9089 Acquired absence of other organs: Secondary | ICD-10-CM

## 2022-07-06 DIAGNOSIS — Z8639 Personal history of other endocrine, nutritional and metabolic disease: Secondary | ICD-10-CM

## 2022-07-06 DIAGNOSIS — R7309 Other abnormal glucose: Secondary | ICD-10-CM

## 2022-07-06 LAB — T4, FREE: Free T4: 1.04 ng/dL (ref 0.60–1.60)

## 2022-07-06 LAB — TSH: TSH: 22.36 u[IU]/mL — ABNORMAL HIGH (ref 0.35–5.50)

## 2022-07-06 LAB — HEMOGLOBIN A1C: Hgb A1c MFr Bld: 5.5 % (ref 4.6–6.5)

## 2022-07-06 MED ORDER — LEVOTHYROXINE SODIUM 137 MCG PO TABS: 137.0000 ug | ORAL_TABLET | Freq: Every day | ORAL | 5 refills | Status: DC

## 2022-07-06 NOTE — Progress Notes (Signed)
Patient ID: Brandy Hansen, female   DOB: 12-02-1985, 37 y.o.   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  Last visit 6 ago.  Interim history: She is feeling better after starting levothyroxine consistently. However, she had a syncopal episode 03/2022 while on the commode - possible vaso-vagal syncope.  She recovered afterwards.  Reviewed history: Patient describes that for approximately a year (2020), she has been feeling more hyperactive, lost almost 30 lbs, has heat intolerance, tremors, itching, SOB, palpitations.  These symptoms worsened fall 2020.  She finally presented to see her PCP on 04/29/2019 and thyroid tests showed thyrotoxicosis.  She was immediately referred to endocrinology.  On 05/11/2019, we diagnosed Graves' disease based on elevated TSI antibodies and started her on methimazole 10 mg bid.  However, she developed a rash with methimazole and we had to switch to PTU 50 mg tid in 06/2019.  Since last visit, she had total thyroidectomy by Dr. Harlow Asa on 10/02/2019.  She was much better after the surgery with only some fatigue.  Calcium was normal >> we stopped her Tums: 11/16/2021: CALCIUM 8.7 - 10.2 mg/dL 9.7   Total Protein 6.0 - 8.5 g/dL 8.1   Albumin, Serum 3.9 - 4.9 g/dL 5.3 High     Lab Results  Component Value Date   CALCIUM 9.5 03/26/2022    We only started levothyroxine on 10/16/2019.  In 12/2020 I advised her to increase the dose from 125 to 137 mcg daily as her TSH was 19.32.  She did not come for repeat TFTs afterwards.   On 11/16/2021, she saw cardiology for chest pain, palpitations and also mentioned a weight loss of 20 pounds in the previous year.  At that time, TSH was 183.  She was off LT4 for 2 months at that time.  On 11/17/2021, she called our office requesting a refill of her  levothyroxine.  She ran out of LT4  2 days ago >> used 112 mcg for the last 2 days.  She is currently taking LT4 137 mcg daily: - in am - fasting - at least 30 min from b'fast -  no calcium, MVI, PPIs - prev. + Fe at night >> stopped - not on Biotin  I reviewed her TFTs: Lab Results  Component Value Date   TSH 4.99 01/03/2022   TSH 19.32 (H) 12/28/2020   TSH 3.37 04/27/2020   TSH 23.37 (H) 11/11/2019   TSH <0.01 (L) 08/18/2019   TSH <0.01 (L) 07/14/2019   TSH <0.01 (L) 06/09/2019   TSH <0.01 Repeated and verified X2. (L) 05/11/2019   TSH <0.005 (L) 04/29/2019   FREET4 1.43 01/03/2022   FREET4 0.93 12/28/2020   FREET4 1.04 04/27/2020   FREET4 1.07 11/11/2019   FREET4 2.01 (H) 08/18/2019   FREET4 0.92 07/14/2019   FREET4 1.60 06/09/2019   FREET4 3.83 (H) 05/11/2019   T3FREE 4.7 (H) 08/18/2019   T3FREE 3.9 07/14/2019   T3FREE 5.2 (H) 06/09/2019   T3FREE 13.0 (H) 05/11/2019  11/16/2021: TSH 183, fT4 0.45  Component     Latest Ref Rng & Units 04/29/2019  TSH     0.450 - 4.500 uIU/mL <0.005 (L)  Thyroxine (T4)     4.5 - 12.0 ug/dL >24.9 (HH)  T3 Uptake Ratio     24 - 39 % 56 (H)  Free Thyroxine Index  1.2 - 4.9 >13.9 (H)   Reviewed her Graves' antibodies: Lab Results  Component Value Date   TSI 137 01/03/2022   TSI 178 (H) 12/28/2020   TSI 145 (H) 05/11/2019  04/29/2019: ATA antibodies <1, TPO antibodies 355 (0-34)  While hyperthyroid, she had: Fatigue, insomnia (chronic-on Seroquel), heat intolerance, tremors, anxiety, palpitations, hyper defecation, weight loss, easy bruising.  Pt does not have a FH of thyroid ds. No FH of thyroid cancer. No h/o radiation tx to head or neck. No herbal supplements. No Biotin use. No recent steroids use.   Mother with DM1.  She also has a history of anemia with Hb of 6.6! Last pregnancy 07/2018.  She has 2 children. 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  Reviewing her chart, I noticed that she had some higher blood sugars: Lab Results  Component Value Date   GLUCOSE 121 (H) 03/26/2022   GLUCOSE 132 (H) 10/03/2019   GLUCOSE 118 (H) 09/29/2019   GLUCOSE 86 04/29/2019   GLUCOSE 75 07/02/2018   GLUCOSE 130 (H) 08/20/2016   GLUCOSE 70 07/21/2016   GLUCOSE 100 (H) 12/18/2015   GLUCOSE 103 (H) 06/24/2011   GLUCOSE 95 06/18/2011   GLUCOSE 86 07/18/2009   No HbA1c available for review: No results found for: "HGBA1C"  She has a history of gestational diabetes. Her mother has a history of diabetes, diagnosed in her 54s.  ROS: + See HPI  I reviewed pt's medications, allergies, PMH, social hx, family hx, and changes were documented in the history of present illness. Otherwise, unchanged from my initial visit note.  Past Medical History:  Diagnosis Date   Anemia    Anxiety    Arthritis    knees   Bipolar 1 disorder ([place])    Chronic kidney disease    ? problems as child, bladder"stretched" fine since   Depression    Fast heart beat    Gestational diabetes    Gestational   HPV (human papilloma virus) anogenital infection    Hypertension    Ovarian cyst    PONV (postoperative nausea and vomiting)    SVD (spontaneous vaginal delivery) 07/24/2018   Past Surgical History:  Procedure Laterality Date   CHONDROPLASTY Right 10/27/2014   Procedure: CHONDROPLASTY;  Surgeon: Dorna Leitz, MD;  Location: [facility];  Service: Orthopedics;  Laterality: Right;   DILATION AND EVACUATION N/A 08/20/2016   Procedure: DILATATION AND EVACUATION;  Surgeon: Sanjuana Kava, MD;  Location: [facility];  Service: Gynecology;  Laterality: N/A;   KNEE ARTHROSCOPY Right 10/27/2014   Procedure: ARTHROSCOPY KNEE with chondroplasty of medial femeral condyle and femoral patella joint;  Surgeon: Dorna Leitz, MD;  Location: [facility];  Service: Orthopedics;  Laterality: Right;   LAPAROSCOPY  06/24/2011   Procedure: LAPAROSCOPY OPERATIVE;   Surgeon: Guss Bunde, MD;  Location: [facility];  Service: Gynecology;  Laterality: Right;  operative laparoscopy and right salpingectomy with removal of ectopic pregnancy   THYROIDECTOMY N/A 10/02/2019   Procedure: TOTAL THYROIDECTOMY;  Surgeon: Armandina Gemma, MD;  Location: [facility];  Service: General;  Laterality: N/A;   WRIST SURGERY     Social History   Socioeconomic History   Marital status: Married    Spouse name: Not on file   Number of children: 2: 9 years and 9 months in 04/2019   Years of education: Not on file   Highest education level: Not on file  Occupational History  Varies diet [  Tobacco Use   Smoking status: Former Smoker, now vapes    Types: Cigarettes    Quit date: 11/23/2017    Years since quitting: 1.4   Smokeless tobacco: Never Used  Substance and Sexual Activity   Alcohol use:     Comment: rarely, beer   Drug use: No   Social Determinants of Radio broadcast assistant Strain:    Difficulty of Paying Living Expenses: Not on file  Food Insecurity:    Worried About Charity fundraiser in the Last Year: Not on file   [employer] of Food in the Last Year: Not on file  Transportation Needs:    Lack of Transportation (Medical): Not on file   Lack of Transportation (Non-Medical): Not on file  Physical Activity:    Days of Exercise per Week: Not on file   Minutes of Exercise per Session: Not on file  Stress:    Feeling of Stress : Not on file  Social Connections:    Frequency of Communication with Friends and Family: Not on file   Frequency of Social Gatherings with Friends and Family: Not on file   Attends Religious Services: Not on file   Active Member of Clubs or Organizations: Not on file   Attends Archivist Meetings: Not on file   Marital Status: Not on file  Intimate Partner Violence:    Fear of Current or Ex-Partner: Not on file   Emotionally Abused: Not on file   Physically Abused: Not on file   Sexually Abused: Not on file   Current  Outpatient Medications on File Prior to Visit  Medication Sig Dispense Refill   clonazePAM (KLONOPIN) 0.5 MG tablet Take 1 tablet (0.5 mg total) by mouth 2 (two) times daily as needed for up to 45 doses for anxiety. (Patient not taking: Reported on 12/28/2020) 45 tablet 3   levothyroxine (SYNTHROID) 137 MCG tablet Take 1 tablet (137 mcg total) by mouth daily before breakfast. 90 tablet 3   QUEtiapine (SEROQUEL) 300 MG tablet Take 1 tablet (300 mg total) by mouth at bedtime. 90 tablet 0   No current facility-administered medications on file prior to visit.   Allergies  Allergen Reactions   Other Hives and Itching   Codeine Itching   Naproxen Nausea And Vomiting and Rash   Sulfa Antibiotics Rash   Family History  Problem Relation Age of Onset   Diabetes Mother    Breast cancer Mother 46   Hypertension Father    Hyperlipidemia Father    Prostate cancer Father    Cancer Maternal Grandmother    Hyperlipidemia Maternal Grandmother    Cancer Maternal Grandfather    Cancer Paternal Grandmother    Cancer Paternal Grandfather    Stroke Paternal Grandfather    Anesthesia problems Neg Hx    PE: BP 118/76 (BP Location: Right Arm, Patient Position: Sitting, Cuff Size: Normal)   Pulse (!) 114   Ht 5\' 7"  (1.702 m)   Wt 126 lb 6.4 oz (57.3 kg)   SpO2 99%   BMI 19.80 kg/m  Wt Readings from Last 3 Encounters:  07/06/22 126 lb 6.4 oz (57.3 kg)  03/26/22 125 lb (56.7 kg)  01/03/22 125 lb 3.2 oz (56.8 kg)   Constitutional: Normal weight, in NAD Eyes: EOMI, no exophthalmos ENT: no neck masses, thyroidectomy scar healed; no cervical lymphadenopathy Cardiovascular: Tachycardia, RR, No MRG Respiratory: CTA B Musculoskeletal: no deformities Skin: no rashes Neurological: no tremor with outstretched  hands  ASSESSMENT: 1.  Postsurgical hypothyroidism  2. S/p total thyroidectomy  3.  History of Graves' disease  4.  High serum glucose  PLAN:  1. Patient with history of uncontrolled  Graves' disease, with intolerance to methimazole due to rash now status post thyroidectomy.  She did feel better after her surgery.  However, she developed uncontrolled hypothyroidism postoperatively. -Her TSH levels was very elevated, at 183 in 10/2021, checked by cardiology.  At that time, she was out of LT4.  She had increased fatigue, chest pain, palpitations. -After restarting LT4, her TSH normalized and she started to feel much better. -She lost 24 pounds before last visit, but weight stabilized afterwards. - latest thyroid labs reviewed with pt.: Lab Results  Component Value Date   TSH 4.99 01/03/2022  - she continues on LT4 137 mcg daily, however, she tells me that she ran out of this 2 days ago and use her girlfriends LT4, 112 mcg daily - pt feels good without complaints at today's visit - we discussed about taking the thyroid hormone every day, with water, >30 minutes before breakfast, separated by >4 hours from acid reflux medications, calcium, iron, multivitamins. Pt. is taking it correctly. - will check thyroid tests today: TSH and fT4 - If labs are abnormal, she will need to return for repeat TFTs in 1.5 months - OTW, I will see her back in 6 months  2. S/p total thyroidectomy -Her thyroidectomy scar has healed with very good aesthetic results.  No swelling, erythema, keloid or pain at the incision site. -He has been on Tums but now off after calcium normalized -She is aware of symptoms of hypocalcemia.  She does not describe numbness, tingling, muscle cramping -Latest calcium level was normal in 10/2021, we will not repeat this today.  3.  History of Graves' disease -Her TSI antibodies were normal at last check, in 12/2021: 137 -She has no signs of Graves' ophthalmopathy: No double vision, blurry vision, eye pain, chemosis. -She saw ophthalmology in the past -We will not repeat her TSI is now  4.  High serum glucose -With history of gestational diabetes and maternal  diabetes history -She had several high blood sugars on regular labs -No blurry vision or significant weight loss, but he does have increased urination and drinks plenty of water.  She has nocturia x 2. -will check a HbA1c today  NEEDS REFILLS.  Component     Latest Ref Rng 07/06/2022  TSH     0.35 - 5.50 uIU/mL 22.36 (H)   T4,Free(Direct)     0.60 - 1.60 ng/dL 1.04   Hemoglobin A1C     4.6 - 6.5 % 5.5   TSH is elevated, consistent with missing doses of levothyroxine.  I underlined at the time of the visit the absolute need to take this tablet every day and not miss doses.  Will continue the current dose for now and repeat the test in 1.5 months.  Philemon Kingdom, MD PhD [facility]

## 2022-07-06 NOTE — Patient Instructions (Addendum)
Please continue levothyroxine 137 mcg daily.  Take the thyroid hormone every day, with water, at least 30 minutes before breakfast, separated by at least 4 hours from: - acid reflux medications - calcium - iron - multivitamins  Please stop at the lab.  Come back for another visit in 6 months.

## 2022-08-28 ENCOUNTER — Other Ambulatory Visit (INDEPENDENT_AMBULATORY_CARE_PROVIDER_SITE_OTHER): Payer: 59

## 2022-08-28 DIAGNOSIS — E890 Postprocedural hypothyroidism: Secondary | ICD-10-CM

## 2022-08-29 ENCOUNTER — Encounter: Payer: Self-pay | Admitting: Internal Medicine

## 2022-08-29 LAB — TSH: TSH: 16.9 u[IU]/mL — ABNORMAL HIGH (ref 0.35–5.50)

## 2022-08-29 LAB — T4, FREE: Free T4: 0.98 ng/dL (ref 0.60–1.60)

## 2022-08-29 MED ORDER — LEVOTHYROXINE SODIUM 150 MCG PO TABS: 150.0000 ug | ORAL_TABLET | Freq: Every day | ORAL | 3 refills | Status: DC

## 2022-11-30 ENCOUNTER — Encounter: Payer: Self-pay | Admitting: Internal Medicine

## 2022-11-30 ENCOUNTER — Other Ambulatory Visit: Payer: Self-pay

## 2022-11-30 DIAGNOSIS — E890 Postprocedural hypothyroidism: Secondary | ICD-10-CM

## 2022-11-30 MED ORDER — LEVOTHYROXINE SODIUM 150 MCG PO TABS: 150.0000 ug | ORAL_TABLET | Freq: Every day | ORAL | 0 refills | Status: DC

## 2023-01-07 ENCOUNTER — Ambulatory Visit: Payer: Self-pay | Admitting: Internal Medicine

## 2023-01-31 ENCOUNTER — Ambulatory Visit (INDEPENDENT_AMBULATORY_CARE_PROVIDER_SITE_OTHER): Payer: 59 | Admitting: Internal Medicine

## 2023-01-31 ENCOUNTER — Encounter: Payer: Self-pay | Admitting: Internal Medicine

## 2023-01-31 VITALS — BP 118/64 | HR 78 | Ht 67.0 in | Wt 125.6 lb

## 2023-01-31 DIAGNOSIS — E890 Postprocedural hypothyroidism: Secondary | ICD-10-CM | POA: Diagnosis not present

## 2023-01-31 DIAGNOSIS — Z8639 Personal history of other endocrine, nutritional and metabolic disease: Secondary | ICD-10-CM | POA: Diagnosis not present
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
## 2023-01-31 NOTE — Patient Instructions (Signed)
Please continue levothyroxine 150 mcg daily.  Take the thyroid hormone every day, with water, at least 30 minutes before breakfast, separated by at least 4 hours from: - acid reflux medications - calcium - iron - multivitamins  Please stop at the lab.  Come back for another visit in 6 months.

## 2023-02-01 ENCOUNTER — Encounter: Payer: Self-pay | Admitting: Internal Medicine

## 2023-02-01 LAB — T4, FREE: Free T4: 1.16 ng/dL (ref 0.60–1.60)

## 2023-02-01 LAB — TSH: TSH: 1.64 u[IU]/mL (ref 0.35–5.50)

## 2023-02-01 MED ORDER — LEVOTHYROXINE SODIUM 150 MCG PO TABS: 150.0000 ug | ORAL_TABLET | Freq: Every day | ORAL | 1 refills | Status: DC

## 2023-02-04 LAB — THYROID STIMULATING IMMUNOGLOBULIN: TSI: 175 % baseline — ABNORMAL HIGH (ref <140)

## 2023-04-04 ENCOUNTER — Encounter: Payer: Self-pay | Admitting: Physician Assistant

## 2023-04-11 ENCOUNTER — Encounter: Payer: Self-pay | Admitting: Physician Assistant

## 2023-04-11 ENCOUNTER — Ambulatory Visit: Payer: 59 | Admitting: Physician Assistant

## 2023-04-11 ENCOUNTER — Other Ambulatory Visit: Payer: 59

## 2023-04-11 VITALS — BP 130/78 | HR 97 | Ht 67.0 in | Wt 129.0 lb

## 2023-04-11 DIAGNOSIS — K921 Melena: Secondary | ICD-10-CM

## 2023-04-11 DIAGNOSIS — R1012 Left upper quadrant pain: Secondary | ICD-10-CM

## 2023-04-11 DIAGNOSIS — R109 Unspecified abdominal pain: Secondary | ICD-10-CM | POA: Diagnosis not present

## 2023-04-11 DIAGNOSIS — R112 Nausea with vomiting, unspecified: Secondary | ICD-10-CM

## 2023-04-11 DIAGNOSIS — Z800 Family history of malignant neoplasm of digestive organs: Secondary | ICD-10-CM

## 2023-04-11 DIAGNOSIS — R110 Nausea: Secondary | ICD-10-CM

## 2023-04-11 DIAGNOSIS — R194 Change in bowel habit: Secondary | ICD-10-CM

## 2023-04-11 DIAGNOSIS — R195 Other fecal abnormalities: Secondary | ICD-10-CM

## 2023-04-11 LAB — COMPREHENSIVE METABOLIC PANEL
ALT: 10 U/L (ref 0–35)
AST: 14 U/L (ref 0–37)
Albumin: 4.7 g/dL (ref 3.5–5.2)
Alkaline Phosphatase: 51 U/L (ref 39–117)
BUN: 10 mg/dL (ref 6–23)
CO2: 25 meq/L (ref 19–32)
Calcium: 9.3 mg/dL (ref 8.4–10.5)
Chloride: 102 meq/L (ref 96–112)
Creatinine, Ser: 0.78 mg/dL (ref 0.40–1.20)
GFR: 95.44 mL/min (ref >60.00)
Glucose, Bld: 97 mg/dL (ref 70–99)
Potassium: 3.6 meq/L (ref 3.5–5.1)
Sodium: 136 meq/L (ref 135–145)
Total Bilirubin: 0.3 mg/dL (ref 0.2–1.2)
Total Protein: 7.7 g/dL (ref 6.0–8.3)

## 2023-04-11 LAB — SEDIMENTATION RATE: Sed Rate: 12 mm/h (ref 0–20)

## 2023-04-11 LAB — CBC WITH DIFFERENTIAL/PLATELET
Basophils Absolute: 0.1 10*3/uL (ref 0.0–0.1)
Basophils Relative: 0.6 % (ref 0.0–3.0)
Eosinophils Absolute: 0.1 10*3/uL (ref 0.0–0.7)
Eosinophils Relative: 0.7 % (ref 0.0–5.0)
HCT: 31 % — ABNORMAL LOW (ref 36.0–46.0)
Hemoglobin: 9.7 g/dL — ABNORMAL LOW (ref 12.0–15.0)
Lymphocytes Relative: 22.3 % (ref 12.0–46.0)
Lymphs Abs: 2.2 10*3/uL (ref 0.7–4.0)
MCHC: 31.4 g/dL (ref 30.0–36.0)
MCV: 73.1 fL — ABNORMAL LOW (ref 78.0–100.0)
Monocytes Absolute: 0.5 10*3/uL (ref 0.1–1.0)
Monocytes Relative: 5.1 % (ref 3.0–12.0)
Neutro Abs: 7.1 10*3/uL (ref 1.4–7.7)
Neutrophils Relative %: 71.3 % (ref 43.0–77.0)
Platelets: 452 10*3/uL — ABNORMAL HIGH (ref 150.0–400.0)
RBC: 4.23 Mil/uL (ref 3.87–5.11)
RDW: 16.7 % — ABNORMAL HIGH (ref 11.5–15.5)
WBC: 9.9 10*3/uL (ref 4.0–10.5)

## 2023-04-11 LAB — HIGH SENSITIVITY CRP: CRP, High Sensitivity: <0.2 mg/L (ref 0.000–5.000)

## 2023-04-11 MED ORDER — ONDANSETRON HCL 4 MG PO TABS: 4.0000 mg | ORAL_TABLET | Freq: Four times a day (QID) | ORAL | 0 refills | Status: DC | PRN

## 2023-04-11 NOTE — Patient Instructions (Signed)
Your provider has requested that you go to the basement level for lab work before leaving today. Press "B" on the elevator. The lab is located at the first door on the left as you exit the elevator.  We have sent the following medications to your pharmacy for you to pick up at your convenience:  Zofran  You have been scheduled for a colonoscopy. Please follow written instructions given to you at your visit today.   Please pick up your prep supplies at the pharmacy within the next 1-3 days.  If you use inhalers (even only as needed), please bring them with you on the day of your procedure.  DO NOT TAKE 7 DAYS PRIOR TO TEST- Trulicity (dulaglutide) Ozempic, Wegovy (semaglutide) Mounjaro (tirzepatide) Bydureon Bcise (exanatide extended release)  DO NOT TAKE 1 DAY PRIOR TO YOUR TEST Rybelsus (semaglutide) Adlyxin (lixisenatide) Victoza (liraglutide) Byetta (exanatide) ___________________________________________________________________________

## 2023-04-11 NOTE — Progress Notes (Signed)
Subjective:    Patient ID: Brandy Hansen, female    DOB: 10/17/1985, 37 y.o.   MRN: [id number]  HPI Brandy Hansen is a pleasant 37 year old white female, new to GI today self-referred with complaints of rectal bleeding over the past 2 months.  Patient has not had any prior GI evaluation.  She has generally been in good health, does have history of Graves' disease for which she underwent thyroidectomy and is on replacement. She says she first had onset of symptoms about 2 months ago noticing blood mixed in with her bowel movements.  This is now occurring on a daily basis and with every bowel movement.  She says the blood is dark red and mixed in with the stool, not separate. She says she used to have a bowel movement about once a week and that had been her pattern for a few years.  After onset of the bleeding she has now having 3-4 bowel movements per day somewhat looser and associated with some urgency.  She is also been having intermittent pain in her left upper abdomen and has had nausea fairly constantly without vomiting.  No fever or chills.  Appetite stable for her no significant weight loss.  She has been feeling somewhat fatigued. He had not been started on any new medications or taken any antibiotics supplements etc. prior to onset of the above symptoms.  No recent labs.  History pertinent for paternal grandfather with colon cancer diagnosed in his elderly years  Review of Systems Pertinent positive and negative review of systems were noted in the above HPI section.  All other review of systems was otherwise negative.   Outpatient Encounter Medications as of 04/11/2023  Medication Sig   levothyroxine (SYNTHROID) 150 MCG tablet Take 1 tablet (150 mcg total) by mouth daily before breakfast.   ondansetron (ZOFRAN) 4 MG tablet Take 1 tablet (4 mg total) by mouth every 6 (six) hours as needed for nausea or vomiting.   [DISCONTINUED] QUEtiapine (SEROQUEL) 300 MG tablet Take 1 tablet (300 mg  total) by mouth at bedtime. (Patient not taking: Reported on 04/11/2023)   No facility-administered encounter medications on file as of 04/11/2023.   Allergies  Allergen Reactions   Other Hives and Itching   Codeine Itching   Naproxen Nausea And Vomiting and Rash   Sulfa Antibiotics Rash   Patient Active Problem List   Diagnosis Date Noted   History of anemia 04/19/2020   Placenta previa 04/19/2020   Postsurgical hypothyroidism 10/16/2019   Graves disease 10/02/2019   Graves' disease 09/27/2019   Anxiety state 05/12/2019   Tachycardia 05/12/2019   HSIL on Pap smear of cervix 04/29/2019   Enlarged thyroid 04/29/2019   Postpartum care following vaginal delivery 07/25/2018   Pregnant 07/24/2018   SVD (spontaneous vaginal delivery) 07/24/2018   Anemia of pregnancy 01/10/2018   Placenta, retained 08/20/2016   Bipolar disorder in full remission (HCC) 11/24/2012   Severe cervical dysplasia 11/24/2012   History of ectopic pregnancy 07/19/2011   Social History   Socioeconomic History   Marital status: Legally Separated    Spouse name: Not on file   Number of children: 2   Years of education: Not on file   Highest education level: Not on file  Occupational History   Not on file  Tobacco Use   Smoking status: Some Days    Current packs/day: 0.00    Types: Cigarettes    Last attempt to quit: 11/23/2017    Years since quitting: 5.3  Smokeless tobacco: Never   Tobacco comments:    20  Vaping Use   Vaping status: Every Day  Substance and Sexual Activity   Alcohol use: Yes    Comment: rarely   Drug use: No   Sexual activity: Yes    Birth control/protection: None  Other Topics Concern   Not on file  Social History Narrative   Not on file   Social Drivers of Health   Financial Resource Strain: Patient Declined (11/13/2021)   Received from [facility], [facility]   Overall Financial Resource Strain (CARDIA)    Difficulty of Paying Living Expenses: Patient declined   Food Insecurity: No Food Insecurity (11/13/2021)   Received from [facility], [facility]   Hunger Vital Sign    Worried About Running Out of Food in the Last Year: Never true    Ran Out of Food in the Last Year: Never true  Transportation Needs: Not on file  Physical Activity: Sufficiently Active (11/13/2021)   Received [facility]   Exercise Vital Sign    Days of Exercise per Week: 7 days    Minutes of Exercise per Session: 150+ min  Stress: Stress Concern Present (11/13/2021)   Received from [facility], [facility] - Occupational Stress Questionnaire    Feeling of Stress : Rather much  Social Connections: Unknown (11/17/2022)   Received from [facility] [place]   Social Network    Social Network: Not on file  Intimate Partner Violence: Unknown (11/17/2022)   Received from [facility]   HITS    Physically Hurt: Not on file    Insult or Talk Down To: Not on file    Threaten Physical Harm: Not on file    Scream or Curse: Not on file    Ms. Felter's family history includes Breast cancer (age of onset: 1) in her mother; Cancer in her maternal grandfather, maternal grandmother, paternal grandfather, and paternal grandmother; Diabetes in her mother; Hyperlipidemia in her father and maternal grandmother; Hypertension in her father; Prostate cancer in her father; Stomach cancer in her maternal grandfather; Stroke in her paternal grandfather.      Objective:    Vitals:   04/11/23 1518  BP: 130/78  Pulse: 97  SpO2: 100%    Physical Exam Well-developed well-nourished white female in no acute distress.  Height, Weight, 129 BMI 20.2  HEENT; nontraumatic normocephalic, EOMI, PE R LA, sclera anicteric. Oropharynx; not examined today Neck; supple, no JVD Cardiovascular; regular rate and rhythm with S1-S2, no murmur rub or gallop Pulmonary; Clear bilaterally Abdomen; soft, nondistended, she is tender in the left  upper quadrant and across the hypogastrium no palpable mass, or hepatosplenomegaly, bowel sounds are somewhat hyperactive Rectal; not done today Skin; benign exam, no jaundice rash or appreciable lesions Extremities; no clubbing cyanosis or edema skin warm and dry Neuro/Psych; alert and oriented x4, grossly nonfocal mood and affect appropriate        Assessment & Plan:   #60 37 year old white female with 31-month history of change in bowel habits going from 1 bowel movement or 2/week to 3-4 bowel movements per day of looser stool, and has been seeing blood mixed in with her bowel movements with every stool.  She has had associated nausea without vomiting and intermittent pain in the left upper quadrant.  No fever or chills, no significant weight loss. No prior GI history, and has not had any workup to date for above issues.  The etiology of her symptoms is not clear, rule out new onset IBD, rule out neoplasm  #2 Graves' disease status post thyroidectomy #3 family history of colon cancer paternal grandfather diagnosed in older years  Plan; CBC with differential, c-Met, sed rate, CRP Start Zofran 4 mg every 6 hours as needed for nausea  Patient has been scheduled for colonoscopy with Dr. Rhea Belton on 04/22/2023, (first available). Procedure was discussed in detail with the patient including indications risk and benefits and she is agreeable to proceed. Further recommendations pending findings at colonoscopy.  Brandy Hansen S Paz Fuentes PA-C 04/11/2023   Cc: No ref. provider found

## 2023-04-12 ENCOUNTER — Encounter: Payer: Self-pay | Admitting: Internal Medicine

## 2023-04-14 NOTE — Progress Notes (Signed)
Addendum: Reviewed and agree with assessment and management plan. Kadijah Shamoon M, MD  

## 2023-04-15 ENCOUNTER — Other Ambulatory Visit: Payer: Self-pay

## 2023-04-15 DIAGNOSIS — Z862 Personal history of diseases of the blood and blood-forming organs and certain disorders involving the immune mechanism: Secondary | ICD-10-CM

## 2023-04-18 ENCOUNTER — Other Ambulatory Visit: Payer: Self-pay

## 2023-04-19 ENCOUNTER — Other Ambulatory Visit (INDEPENDENT_AMBULATORY_CARE_PROVIDER_SITE_OTHER): Payer: 59

## 2023-04-19 DIAGNOSIS — Z862 Personal history of diseases of the blood and blood-forming organs and certain disorders involving the immune mechanism: Secondary | ICD-10-CM

## 2023-04-19 LAB — IBC + FERRITIN
Ferritin: 2.1 ng/mL — ABNORMAL LOW (ref 10.0–291.0)
Iron: 13 ug/dL — ABNORMAL LOW (ref 42–145)
Saturation Ratios: 2.4 % — ABNORMAL LOW (ref 20.0–50.0)
TIBC: 550.2 ug/dL — ABNORMAL HIGH (ref 250.0–450.0)
Transferrin: 393 mg/dL — ABNORMAL HIGH (ref 212.0–360.0)

## 2023-04-20 LAB — IRON AND TIBC
Iron Saturation: 4 % — CL (ref 15–55)
Iron: 17 ug/dL — ABNORMAL LOW (ref 27–159)
Total Iron Binding Capacity: 471 ug/dL — ABNORMAL HIGH (ref 250–450)
UIBC: 454 ug/dL — ABNORMAL HIGH (ref 131–425)

## 2023-04-22 ENCOUNTER — Ambulatory Visit: Payer: 59 | Admitting: Internal Medicine

## 2023-04-22 ENCOUNTER — Encounter: Payer: Self-pay | Admitting: Internal Medicine

## 2023-04-22 VITALS — BP 119/83 | HR 75 | Temp 99.0°F | Resp 12 | Ht 67.0 in | Wt 129.0 lb

## 2023-04-22 DIAGNOSIS — K635 Polyp of colon: Secondary | ICD-10-CM | POA: Diagnosis not present

## 2023-04-22 DIAGNOSIS — K633 Ulcer of intestine: Secondary | ICD-10-CM

## 2023-04-22 DIAGNOSIS — D122 Benign neoplasm of ascending colon: Secondary | ICD-10-CM

## 2023-04-22 DIAGNOSIS — R109 Unspecified abdominal pain: Secondary | ICD-10-CM

## 2023-04-22 DIAGNOSIS — D124 Benign neoplasm of descending colon: Secondary | ICD-10-CM

## 2023-04-22 DIAGNOSIS — R194 Change in bowel habit: Secondary | ICD-10-CM

## 2023-04-22 HISTORY — PX: COLONOSCOPY: SHX174

## 2023-04-22 HISTORY — PX: COLONOSCOPY WITH PROPOFOL: SHX5780

## 2023-04-22 MED ORDER — SODIUM CHLORIDE 0.9 % IV SOLN: 500.0000 mL | Freq: Once | INTRAVENOUS | Status: DC

## 2023-04-22 NOTE — Progress Notes (Signed)
Called to room to assist during endoscopic procedure.  Patient ID and intended procedure confirmed with present staff. Received instructions for my participation in the procedure from the performing physician.  

## 2023-04-22 NOTE — Progress Notes (Signed)
Vss nad trans to pacu 

## 2023-04-22 NOTE — Op Note (Signed)
[facility] Patient Name: Brandy Hansen Procedure Date: 04/22/2023 11:27 AM MRN: [id number] Endoscopist: Beverley Fiedler , MD, [id number] Age: 37 Referring MD:  Date of Birth: [birth year] Gender: Female Account #: [id number] Procedure:                Colonoscopy Indications:              Abdominal pain in the left lower quadrant, Change                            in bowel habits Medicines:                Monitored Anesthesia Care Procedure:                Pre-Anesthesia Assessment:                           - Prior to the procedure, a History and Physical                            was performed, and patient medications and                            allergies were reviewed. The patient's tolerance of                            previous anesthesia was also reviewed. The risks                            and benefits of the procedure and the sedation                            options and risks were discussed with the patient.                            All questions were answered, and informed consent                            was obtained. Prior Anticoagulants: The patient has                            taken no anticoagulant or antiplatelet agents. ASA                            Grade Assessment: II - A patient with mild systemic                            disease. After reviewing the risks and benefits,                            the patient was deemed in satisfactory condition to                            undergo the procedure.  After obtaining informed consent, the colonoscope                            was passed under direct vision. Throughout the                            procedure, the patient's blood pressure, pulse, and                            oxygen saturations were monitored continuously. The                            Olympus Scope SN: [id number] was introduced through                            the anus and advanced to the terminal  ileum. The                            colonoscopy was performed without difficulty. The                            patient tolerated the procedure well. The quality                            of the bowel preparation was good. The terminal                            ileum, ileocecal valve, appendiceal orifice, and                            rectum were photographed. Scope In: 11:38:01 AM Scope Out: 12:00:15 PM Scope Withdrawal Time: 0 hours 18 minutes 42 seconds  Total Procedure Duration: 0 hours 22 minutes 14 seconds  Findings:                 The digital rectal exam was normal.                           The terminal ileum contained a few non-bleeding                            erosions. Biopsies were taken with a cold forceps                            for histology.                           A 10 mm polyp was found in the ascending colon. The                            polyp was flat. The polyp was removed with a cold                            snare. Resection and retrieval were complete.  An 8 mm polyp was found in the descending colon.                            The polyp was mucous-capped. The polyp was removed                            with a cold snare. Resection and retrieval were                            complete.                           The exam was otherwise without abnormality.                           The retroflexed view of the distal rectum and anal                            verge was normal and showed no anal or rectal                            abnormalities. Complications:            No immediate complications. Estimated Blood Loss:     Estimated blood loss was minimal. Impression:               - A few erosions in the terminal ileum. Biopsied.                           - One 10 mm polyp in the ascending colon, removed                            with a cold snare. Resected and retrieved.                           - One 8 mm polyp in  the descending colon, removed                            with a cold snare. Resected and retrieved.                           - The examination was otherwise normal.                           - The distal rectum and anal verge are normal on                            retroflexion view. Recommendation:           - Patient has a contact number available for                            emergencies. The signs and symptoms of potential  delayed complications were discussed with the                            patient. Return to normal activities tomorrow.                            Written discharge instructions were provided to the                            patient.                           - Resume previous diet.                           - Continue present medications.                           - Await pathology results.                           - Repeat colonoscopy is recommended. The                            colonoscopy date will be determined after pathology                            results from today's exam become available for                            review. Beverley Fiedler, MD 04/22/2023 12:10:38 PM This report has been signed electronically.

## 2023-04-22 NOTE — Patient Instructions (Addendum)
   Handouts on polyps given to you today.   Await pathology results on polyps removed & results of biopsies done in small bowel   Continue previous diet & medications   YOU HAD AN ENDOSCOPIC PROCEDURE TODAY AT THE [facility]:   Refer to the procedure report that was given to you for any specific questions about what was found during the examination.  If the procedure report does not answer your questions, please call your gastroenterologist to clarify.  If you requested that your care partner not be given the details of your procedure findings, then the procedure report has been included in a sealed envelope for you to review at your convenience later.  YOU SHOULD EXPECT: Some feelings of bloating in the abdomen. Passage of more gas than usual.  Walking can help get rid of the air that was put into your GI tract during the procedure and reduce the bloating. If you had a lower endoscopy (such as a colonoscopy or flexible sigmoidoscopy) you may notice spotting of blood in your stool or on the toilet paper. If you underwent a bowel prep for your procedure, you may not have a normal bowel movement for a few days.  Please Note:  You might notice some irritation and congestion in your nose or some drainage.  This is from the oxygen used during your procedure.  There is no need for concern and it should clear up in a day or so.  SYMPTOMS TO REPORT IMMEDIATELY:  Following lower endoscopy (colonoscopy or flexible sigmoidoscopy):  Excessive amounts of blood in the stool  Significant tenderness or worsening of abdominal pains  Swelling of the abdomen that is new, acute  Fever of 100F or higher  For urgent or emergent issues, a gastroenterologist can be reached at any hour by calling [phone]. Do not use MyChart messaging for urgent concerns.    DIET:  We do recommend a small meal at first, but then you may proceed to your regular diet.  Drink plenty of fluids but you should avoid  alcoholic beverages for 24 hours.  ACTIVITY:  You should plan to take it easy for the rest of today and you should NOT DRIVE or use heavy machinery until tomorrow (because of the sedation medicines used during the test).    FOLLOW UP: Our staff will call the number listed on your records the next business day following your procedure.  We will call around 7:15- 8:00 am to check on you and address any questions or concerns that you may have regarding the information given to you following your procedure. If we do not reach you, we will leave a message.     If any biopsies were taken you will be contacted by phone or by letter within the next 1-3 weeks.  Please call us at [phone] if you have not heard about the biopsies in 3 weeks.    SIGNATURES/CONFIDENTIALITY: You and/or your care partner have signed paperwork which will be entered into your electronic medical record.  These signatures attest to the fact that that the information above on your After Visit Summary has been reviewed and is understood.  Full responsibility of the confidentiality of this discharge information lies with you and/or your care-partner.

## 2023-04-23 ENCOUNTER — Telehealth: Payer: Self-pay | Admitting: *Deleted

## 2023-04-23 NOTE — Telephone Encounter (Signed)
  Follow up Call-     04/22/2023   10:53 AM  Call back number  Post procedure Call Back phone  # [phone]  Permission to leave phone message Yes     Patient questions:   Message left to call us if necessary.

## 2023-04-25 ENCOUNTER — Other Ambulatory Visit: Payer: Self-pay

## 2023-04-25 DIAGNOSIS — D509 Iron deficiency anemia, unspecified: Secondary | ICD-10-CM

## 2023-04-25 LAB — SURGICAL PATHOLOGY

## 2023-04-25 MED ORDER — FERROUS SULFATE 325 (65 FE) MG PO TABS: 325.0000 mg | ORAL_TABLET | Freq: Two times a day (BID) | ORAL | 5 refills | Status: DC

## 2023-04-29 ENCOUNTER — Encounter: Payer: Self-pay | Admitting: Internal Medicine

## 2023-04-29 ENCOUNTER — Other Ambulatory Visit (INDEPENDENT_AMBULATORY_CARE_PROVIDER_SITE_OTHER): Payer: 59

## 2023-04-29 DIAGNOSIS — D509 Iron deficiency anemia, unspecified: Secondary | ICD-10-CM

## 2023-04-29 LAB — HEMOGLOBIN: Hemoglobin: 9.8 g/dL — ABNORMAL LOW (ref 12.0–15.0)

## 2023-04-30 ENCOUNTER — Other Ambulatory Visit: Payer: Self-pay

## 2023-04-30 DIAGNOSIS — D509 Iron deficiency anemia, unspecified: Secondary | ICD-10-CM

## 2023-04-30 DIAGNOSIS — R195 Other fecal abnormalities: Secondary | ICD-10-CM

## 2023-05-01 ENCOUNTER — Other Ambulatory Visit: Payer: Self-pay

## 2023-05-01 ENCOUNTER — Other Ambulatory Visit: Payer: Self-pay | Admitting: Internal Medicine

## 2023-05-01 DIAGNOSIS — Z862 Personal history of diseases of the blood and blood-forming organs and certain disorders involving the immune mechanism: Secondary | ICD-10-CM

## 2023-05-01 DIAGNOSIS — D509 Iron deficiency anemia, unspecified: Secondary | ICD-10-CM | POA: Insufficient documentation

## 2023-05-01 NOTE — Progress Notes (Signed)
 IV iron for IDA

## 2023-05-02 ENCOUNTER — Other Ambulatory Visit: Payer: Self-pay | Admitting: Internal Medicine

## 2023-05-02 ENCOUNTER — Telehealth: Payer: Self-pay

## 2023-05-02 ENCOUNTER — Other Ambulatory Visit: Payer: 59

## 2023-05-02 DIAGNOSIS — D509 Iron deficiency anemia, unspecified: Secondary | ICD-10-CM

## 2023-05-02 DIAGNOSIS — R195 Other fecal abnormalities: Secondary | ICD-10-CM

## 2023-05-02 NOTE — Telephone Encounter (Signed)
 Auth Submission: NO AUTH NEEDED Site of care: Site of care: [facility] Payer: [employer] Medication & CPT/J Code(s) submitted: Venofer  (Iron  Sucrose) J1756 Route of submission (phone, fax, portal):  Phone # Fax # Auth type: Buy/Bill PB Units/visits requested: 200mg  x 5 doses Reference number:  Approval from: 05/02/23 to 10/21/23

## 2023-05-05 LAB — GLIA (IGA/G) + TTG IGA
Antigliadin Abs, IgA: 3 U (ref 0–19)
Gliadin IgG: 3 U (ref 0–19)

## 2023-05-06 ENCOUNTER — Other Ambulatory Visit: Payer: Self-pay | Admitting: *Deleted

## 2023-05-06 DIAGNOSIS — D509 Iron deficiency anemia, unspecified: Secondary | ICD-10-CM

## 2023-05-08 ENCOUNTER — Ambulatory Visit (INDEPENDENT_AMBULATORY_CARE_PROVIDER_SITE_OTHER): Payer: 59

## 2023-05-08 VITALS — BP 122/77 | HR 83 | Temp 98.5°F | Resp 18 | Ht 67.0 in | Wt 126.8 lb

## 2023-05-08 DIAGNOSIS — D509 Iron deficiency anemia, unspecified: Secondary | ICD-10-CM | POA: Diagnosis not present

## 2023-05-08 MED ORDER — DIPHENHYDRAMINE HCL 25 MG PO CAPS: 25.0000 mg | ORAL_CAPSULE | Freq: Once | ORAL | Status: DC

## 2023-05-08 MED ORDER — IRON SUCROSE 20 MG/ML IV SOLN
200.0000 mg | Freq: Once | INTRAVENOUS | Status: AC
  Administered 2023-05-08: 200 mg via INTRAVENOUS
  Filled 2023-05-08: qty 10

## 2023-05-08 MED ORDER — ACETAMINOPHEN 325 MG PO TABS
650.0000 mg | ORAL_TABLET | Freq: Once | ORAL | Status: DC
Start: 2023-05-08 — End: 2023-05-08

## 2023-05-08 NOTE — Progress Notes (Signed)
 Diagnosis: Acute Anemia  Provider:  Chilton Greathouse MD  Procedure: IV Push  IV Type: Peripheral, IV Location: R Antecubital  Venofer (Iron Sucrose), Dose: 200 mg  Post Infusion IV Care: Observation period completed and Peripheral IV Discontinued  Discharge: Condition: Good, Destination: Home . AVS Declined  Performed by:  Nat Math, RN

## 2023-05-10 ENCOUNTER — Ambulatory Visit: Payer: 59

## 2023-05-10 ENCOUNTER — Other Ambulatory Visit: Payer: Self-pay

## 2023-05-10 VITALS — BP 132/77 | HR 82 | Temp 98.5°F | Resp 16 | Ht 67.0 in | Wt 126.6 lb

## 2023-05-10 DIAGNOSIS — D509 Iron deficiency anemia, unspecified: Secondary | ICD-10-CM

## 2023-05-10 DIAGNOSIS — Z862 Personal history of diseases of the blood and blood-forming organs and certain disorders involving the immune mechanism: Secondary | ICD-10-CM

## 2023-05-10 MED ORDER — IRON SUCROSE 20 MG/ML IV SOLN
200.0000 mg | Freq: Once | INTRAVENOUS | Status: AC
  Administered 2023-05-10: 200 mg via INTRAVENOUS
  Filled 2023-05-10: qty 10

## 2023-05-10 MED ORDER — ACETAMINOPHEN 325 MG PO TABS
650.0000 mg | ORAL_TABLET | Freq: Once | ORAL | Status: DC
Start: 2023-05-10 — End: 2023-05-10

## 2023-05-10 MED ORDER — DIPHENHYDRAMINE HCL 25 MG PO CAPS
25.0000 mg | ORAL_CAPSULE | Freq: Once | ORAL | Status: DC
Start: 2023-05-10 — End: 2023-05-10

## 2023-05-10 NOTE — Telephone Encounter (Signed)
I would get the heme opinion for other possible causes of IDA

## 2023-05-10 NOTE — Progress Notes (Signed)
 Diagnosis: Acute Anemia  Provider:  Chilton Greathouse MD  Procedure: IV Push  IV Type: Peripheral, IV Location: L Antecubital  Venofer (Iron Sucrose), Dose: 200 mg  Post Infusion IV Care: Observation period completed and Peripheral IV Discontinued  Discharge: Condition: Good, Destination: Home . AVS Declined  Performed by:  Nat Math, RN

## 2023-05-14 ENCOUNTER — Ambulatory Visit: Payer: 59

## 2023-05-14 VITALS — BP 123/82 | HR 88 | Temp 98.5°F | Resp 12 | Ht 67.0 in | Wt 124.4 lb

## 2023-05-14 DIAGNOSIS — D509 Iron deficiency anemia, unspecified: Secondary | ICD-10-CM

## 2023-05-14 MED ORDER — ACETAMINOPHEN 325 MG PO TABS: 650.0000 mg | ORAL_TABLET | Freq: Once | ORAL | Status: DC

## 2023-05-14 MED ORDER — DIPHENHYDRAMINE HCL 25 MG PO CAPS: 25.0000 mg | ORAL_CAPSULE | Freq: Once | ORAL | Status: DC

## 2023-05-14 MED ORDER — IRON SUCROSE 20 MG/ML IV SOLN
200.0000 mg | Freq: Once | INTRAVENOUS | Status: AC
  Administered 2023-05-14: 200 mg via INTRAVENOUS
  Filled 2023-05-14: qty 10

## 2023-05-14 NOTE — Progress Notes (Cosign Needed Addendum)
Diagnosis: Iron Deficiency Anemia  Provider:  Chilton Greathouse MD  Procedure: IV Push  IV Type: Peripheral, IV Location: L Antecubital  Venofer (Iron Sucrose), Dose: 200 mg  Post Infusion IV Care: Observation period completed and Peripheral IV Discontinued  Discharge: Condition: Good, Destination: Home . AVS Declined  Performed by:  Adriana Mccallum, RN    Patient refused pre-medications. Nurse educated patient and stressed the importance of taking pre-medications as a precaution in the event of a medication reaction. Patient verbalized understanding.

## 2023-05-16 ENCOUNTER — Ambulatory Visit: Payer: 59

## 2023-05-16 VITALS — BP 117/77 | HR 80 | Temp 98.8°F | Resp 16 | Ht 67.0 in | Wt 127.4 lb

## 2023-05-16 DIAGNOSIS — D509 Iron deficiency anemia, unspecified: Secondary | ICD-10-CM

## 2023-05-16 MED ORDER — ACETAMINOPHEN 325 MG PO TABS: 650.0000 mg | ORAL_TABLET | Freq: Once | ORAL | Status: DC

## 2023-05-16 MED ORDER — IRON SUCROSE 20 MG/ML IV SOLN
200.0000 mg | Freq: Once | INTRAVENOUS | Status: AC
  Administered 2023-05-16: 200 mg via INTRAVENOUS
  Filled 2023-05-16: qty 10

## 2023-05-16 MED ORDER — DIPHENHYDRAMINE HCL 25 MG PO CAPS: 25.0000 mg | ORAL_CAPSULE | Freq: Once | ORAL | Status: DC

## 2023-05-16 NOTE — Progress Notes (Signed)
 Diagnosis: Acute Anemia  Provider:  Chilton Greathouse MD  Procedure: IV Push  IV Type: Peripheral, IV Location: R Antecubital  Venofer (Iron Sucrose), Dose: 200 mg  Post Infusion IV Care: Observation period completed and Peripheral IV Discontinued  Discharge: Condition: Good, Destination: Home . AVS Declined  Performed by:  Nat Math, RN

## 2023-05-20 ENCOUNTER — Ambulatory Visit: Payer: 59

## 2023-05-20 ENCOUNTER — Telehealth: Payer: Self-pay | Admitting: Hematology

## 2023-05-20 VITALS — BP 130/85 | HR 84 | Temp 99.5°F | Resp 16 | Ht 67.0 in | Wt 128.4 lb

## 2023-05-20 DIAGNOSIS — D509 Iron deficiency anemia, unspecified: Secondary | ICD-10-CM | POA: Diagnosis not present

## 2023-05-20 MED ORDER — IRON SUCROSE 20 MG/ML IV SOLN
200.0000 mg | Freq: Once | INTRAVENOUS | Status: AC
  Administered 2023-05-20: 200 mg via INTRAVENOUS

## 2023-05-20 MED ORDER — DIPHENHYDRAMINE HCL 25 MG PO CAPS: 25.0000 mg | ORAL_CAPSULE | Freq: Once | ORAL | Status: DC

## 2023-05-20 MED ORDER — ACETAMINOPHEN 325 MG PO TABS: 650.0000 mg | ORAL_TABLET | Freq: Once | ORAL | Status: DC

## 2023-05-20 NOTE — Progress Notes (Signed)
Diagnosis: Iron Deficiency Anemia  Provider:  Chilton Greathouse MD  Procedure: IV Push  IV Type: Peripheral, IV Location: L Antecubital  Venofer (Iron Sucrose), Dose: 200 mg  Patient stated that she already took her premedications at home at least 30 minutes prior to her infusion appointment.   Post Infusion IV Care: Observation period completed and Peripheral IV Discontinued. 15 minute post observation period per patient request.  Discharge: Condition: Stable, Destination: Home . AVS Declined  Performed by:  Wyvonne Lenz, RN

## 2023-05-20 NOTE — Telephone Encounter (Signed)
Patient is aware of scheduled New Patient appointment times/dates/location. Patient has been advised to show up at least 15 mins prior for registration

## 2023-06-19 ENCOUNTER — Telehealth: Payer: Self-pay | Admitting: Hematology

## 2023-06-19 ENCOUNTER — Inpatient Hospital Stay: Payer: 59

## 2023-06-19 ENCOUNTER — Inpatient Hospital Stay: Payer: 59 | Admitting: Hematology

## 2023-06-20 NOTE — Telephone Encounter (Signed)
 Patient called to cancel appt, unable to make appt .

## 2023-07-29 DIAGNOSIS — R002 Palpitations: Secondary | ICD-10-CM | POA: Diagnosis not present

## 2023-07-29 DIAGNOSIS — Z01818 Encounter for other preprocedural examination: Secondary | ICD-10-CM | POA: Diagnosis not present

## 2023-07-29 DIAGNOSIS — R42 Dizziness and giddiness: Secondary | ICD-10-CM | POA: Diagnosis not present

## 2023-07-29 DIAGNOSIS — R0789 Other chest pain: Secondary | ICD-10-CM | POA: Diagnosis not present

## 2023-07-31 ENCOUNTER — Other Ambulatory Visit: Payer: Self-pay

## 2023-07-31 DIAGNOSIS — D509 Iron deficiency anemia, unspecified: Secondary | ICD-10-CM

## 2023-07-31 NOTE — Progress Notes (Signed)
 Brandy Hansen

## 2023-08-01 ENCOUNTER — Ambulatory Visit (INDEPENDENT_AMBULATORY_CARE_PROVIDER_SITE_OTHER): Payer: 59 | Admitting: Internal Medicine

## 2023-08-01 ENCOUNTER — Encounter: Payer: Self-pay | Admitting: Internal Medicine

## 2023-08-01 VITALS — BP 118/80 | HR 90 | Ht 67.0 in | Wt 117.4 lb

## 2023-08-01 DIAGNOSIS — Z9889 Other specified postprocedural states: Secondary | ICD-10-CM

## 2023-08-01 DIAGNOSIS — Z9089 Acquired absence of other organs: Secondary | ICD-10-CM | POA: Diagnosis not present

## 2023-08-01 DIAGNOSIS — E890 Postprocedural hypothyroidism: Secondary | ICD-10-CM

## 2023-08-01 DIAGNOSIS — Z8639 Personal history of other endocrine, nutritional and metabolic disease: Secondary | ICD-10-CM | POA: Diagnosis not present

## 2023-08-01 LAB — T4, FREE: Free T4: 2 ng/dL — ABNORMAL HIGH (ref 0.8–1.8)

## 2023-08-01 LAB — TSH: TSH: 1.92 m[IU]/L

## 2023-08-01 NOTE — Progress Notes (Addendum)
 Patient ID: Brandy Hansen, female   DOB: [birth year], 38 y.o.   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  Last visit 6 mo ago.  Interim history: At today's visit, she mentions a return of tremors, heat intolerance palpitations, weight loss, insomnia (sleeps approximately 2 hours at night).  She has increased stress as she is also taking care of her children and she is a caregiver for her parents and has to keep up with their appointments, also. She has palpitations >> was evaluated by cardiology >> will have a heart monitor.  She was supposed to have a hysterectomy, but this is on hold for now. She changed her job before last visit and looked through microscope for the majority of the day.  She had some blurry vision and dry eyes >> improved now.  Reviewed history: Patient describes that for approximately a year (2020), she has been feeling more hyperactive, lost almost 30 lbs, has heat intolerance, tremors, itching, SOB, palpitations.  These symptoms worsened fall 2020.  She finally presented to see her PCP on 04/29/2019 and thyroid tests showed thyrotoxicosis.  She was immediately referred to endocrinology.  On 05/11/2019, we diagnosed Graves' disease based on elevated TSI antibodies and started her on methimazole 10 mg bid.  However, she developed a rash with methimazole and we had to switch to PTU 50 mg tid in 06/2019.  Since last visit, she had total thyroidectomy by Dr. Sofia Hansen on 10/02/2019.  She was much better after the surgery with only some fatigue.  Calcium was normal >> we stopped her Tums: 11/16/2021: CALCIUM 8.7 - 10.2 mg/dL 9.7   Total Protein 6.0 - 8.5 g/dL 8.1   Albumin, Serum 3.9 - 4.9 g/dL 5.3 High     Lab Results  Component Value Date   CALCIUM 9.3 04/11/2023    We only started levothyroxine on 10/16/2019.  In 12/2020 I advised her to  increase the dose from 125 to 137 mcg daily as her TSH was 19.32.  She did not come for repeat TFTs afterwards.   On 11/16/2021, she saw cardiology for chest pain, palpitations and also mentioned a weight loss of 20 pounds in the previous year.  At that time, TSH was 183.  She was off LT4 for 2 months at that time.  She ran out of LT4  2 days prior to our 06/2022 appointment and was using a lower dose, 112 mcg for the previous 2 days.  I refilled her 137 mcg prescription.  In 08/2022, we increased the dose to 150 mcg daily.  She is currently taking LT4 150 mcg daily: - in am - fasting - at least 30 min from b'fast >> now skips; only eats 1 meal a day -  no calcium, MVI, PPIs - prev. + Fe at night >> stopped - not on Biotin  I reviewed her TFTs: Lab Results  Component Value Date   TSH 1.64 01/31/2023   TSH 16.90 (H) 08/28/2022   TSH 22.36 (H) 07/06/2022   TSH 4.99 01/03/2022   TSH 19.32 (H) 12/28/2020   TSH 3.37 04/27/2020   TSH 23.37 (H) 11/11/2019   TSH <0.01 (L) 08/18/2019   TSH <0.01 (L) 07/14/2019   TSH <0.01 (L) 06/09/2019   FREET4 1.16 01/31/2023   FREET4 0.98 08/28/2022   FREET4 1.04 07/06/2022   FREET4 1.43 01/03/2022   FREET4 0.93 12/28/2020  FREET4 1.04 04/27/2020   FREET4 1.07 11/11/2019   FREET4 2.01 (H) 08/18/2019   FREET4 0.92 07/14/2019   FREET4 1.60 06/09/2019   T3FREE 4.7 (H) 08/18/2019   T3FREE 3.9 07/14/2019   T3FREE 5.2 (H) 06/09/2019   T3FREE 13.0 (H) 05/11/2019  11/16/2021: TSH 183, fT4 0.45  Component     Latest Ref Rng & Units 04/29/2019  TSH     0.450 - 4.500 uIU/mL <0.005 (L)  Thyroxine (T4)     4.5 - 12.0 ug/dL >16.1 (HH)  T3 Uptake Ratio     24 - 39 % 56 (H)  Free Thyroxine Index     1.2 - 4.9 >13.9 (H)   Reviewed her Graves' antibodies: Lab Results  Component Value Date   TSI 175 (H) 01/31/2023   TSI 137 01/03/2022   TSI 178 (H) 12/28/2020   TSI 145 (H) 05/11/2019  04/29/2019: ATA antibodies <1, TPO antibodies 355 (0-34)  She  previously mentioned: Fatigue, insomnia (chronic-on Seroquel), heat intolerance, tremors, anxiety, palpitations, hyper defecation, weight loss, easy bruising.  These resolved after her total thyroidectomy, but recurred afterwards.  Pt does not have a FH of thyroid ds. No FH of thyroid cancer. No h/o radiation tx to head or neck. No herbal supplements. No Biotin use. No recent steroids use.   Mother with DM1.  Reviewing her chart, I noticed that she had some higher blood sugars: Lab Results  Component Value Date   GLUCOSE 97 04/11/2023   GLUCOSE 121 (H) 03/26/2022   GLUCOSE 132 (H) 10/03/2019   GLUCOSE 118 (H) 09/29/2019   GLUCOSE 86 04/29/2019   GLUCOSE 75 07/02/2018   GLUCOSE 130 (H) 08/20/2016   GLUCOSE 70 07/21/2016   GLUCOSE 100 (H) 12/18/2015   GLUCOSE 103 (H) 06/24/2011   GLUCOSE 95 06/18/2011   GLUCOSE 86 07/18/2009   HbA1c reviewed: Lab Results  Component Value Date   HGBA1C 5.5 07/06/2022   She has a history of gestational diabetes. Her mother has a history of diabetes, diagnosed in her 27s.  She also has a history of anemia with Hb of 6.6!  She had a syncopal episode 03/2022 while on the commode - possible vaso-vagal syncope.  Lipids: 07/29/2023: 150/81/58/77.  Last pregnancy 07/2018.  She has 2 children.  ROS: + See HPI  I reviewed pt's medications, allergies, PMH, social hx, family hx, and changes were documented in the history of present illness. Otherwise, unchanged from my initial visit note.  Past Medical History:  Diagnosis Date   Anemia    Anxiety    Arthritis    knees   Bipolar 1 disorder (HCC)    Chronic kidney disease    ? problems as child, bladder"stretched" fine since   Depression    Fast heart beat    Gestational diabetes    Gestational   HPV (human papilloma virus) anogenital infection    Hypertension    Ovarian cyst    PONV (postoperative nausea and vomiting)    SVD (spontaneous vaginal delivery) 07/24/2018   Past Surgical History:   Procedure Laterality Date   CHONDROPLASTY Right 10/27/2014   Procedure: CHONDROPLASTY;  Surgeon: Brandy Balls, MD;  Location: [facility];  Service: Orthopedics;  Laterality: Right;   COLONOSCOPY  04/22/2023   Brandy Hansen at [facility]   DILATION AND EVACUATION N/A 08/20/2016   Procedure: DILATATION AND EVACUATION;  Surgeon: Artemisa Bile, MD;  Location: [facility];  Service: Gynecology;  Laterality: N/A;   KNEE ARTHROSCOPY Right 10/27/2014  Procedure: ARTHROSCOPY KNEE with chondroplasty of medial femeral condyle and femoral patella joint;  Surgeon: Brandy Balls, MD;  Location: [facility];  Service: Orthopedics;  Laterality: Right;   LAPAROSCOPY  06/24/2011   Procedure: LAPAROSCOPY OPERATIVE;  Surgeon: Rik Chasten, MD;  Location: [facility];  Service: Gynecology;  Laterality: Right;  operative laparoscopy and right salpingectomy with removal of ectopic pregnancy   THYROIDECTOMY N/A 10/02/2019   Procedure: TOTAL THYROIDECTOMY;  Surgeon: Oralee Billow, MD;  Location: [facility];  Service: General;  Laterality: N/A;   WRIST SURGERY     Social History   Socioeconomic History   Marital status: Married    Spouse name: Not on file   Number of children: 2: 9 years and 9 months in 04/2019   Years of education: Not on file   Highest education level: Not on file  Occupational History    Varies diet [  Tobacco Use   Smoking status: Former Smoker, now vapes    Types: Cigarettes    Quit date: 11/23/2017    Years since quitting: 1.4   Smokeless tobacco: Never Used  Substance and Sexual Activity   Alcohol use:     Comment: rarely, beer   Drug use: No   Social Determinants of Corporate investment banker Strain:    Difficulty of Paying Living Expenses: Not on file  Food Insecurity:    Worried About Programme researcher, broadcasting/film/video in the Last Year: Not on file   [employer] of Food in the Last Year: Not on file  Transportation Needs:    Lack of Transportation (Medical): Not on file   Lack of  Transportation (Non-Medical): Not on file  Physical Activity:    Days of Exercise per Week: Not on file   Minutes of Exercise per Session: Not on file  Stress:    Feeling of Stress : Not on file  Social Connections:    Frequency of Communication with Friends and Family: Not on file   Frequency of Social Gatherings with Friends and Family: Not on file   Attends Religious Services: Not on file   Active Member of Clubs or Organizations: Not on file   Attends Banker Meetings: Not on file   Marital Status: Not on file  Intimate Partner Violence:    Fear of Current or Ex-Partner: Not on file   Emotionally Abused: Not on file   Physically Abused: Not on file   Sexually Abused: Not on file   Current Outpatient Medications on File Prior to Visit  Medication Sig Dispense Refill   levothyroxine (SYNTHROID) 150 MCG tablet Take 1 tablet (150 mcg total) by mouth daily before breakfast. 90 tablet 1   No current facility-administered medications on file prior to visit.   Allergies  Allergen Reactions   Other Hives and Itching    Graves disease - thyroid surgically removed  - pt does not remember medication name - no longer taking medication   Codeine Itching   Naproxen Nausea And Vomiting and Rash   Sulfa Antibiotics Rash   Family History  Problem Relation Age of Onset   Diabetes Mother    Breast cancer Mother 65   Hypertension Father    Hyperlipidemia Father    Prostate cancer Father    Stomach cancer Paternal Uncle    Cancer Maternal Grandmother    Hyperlipidemia Maternal Grandmother    Cancer Maternal Grandfather    Cancer Paternal Grandmother    Colon cancer Paternal Grandfather  Cancer Paternal Grandfather    Stroke Paternal Grandfather    Anesthesia problems Neg Hx    Esophageal cancer Neg Hx    Colon polyps Neg Hx    Rectal cancer Neg Hx    PE: BP 118/80   Pulse 90   Ht 5\' 7"  (1.702 m)   Wt 117 lb 6.4 oz (53.3 kg)   SpO2 98%   BMI 18.39 kg/m  Wt  Readings from Last 10 Encounters:  08/01/23 117 lb 6.4 oz (53.3 kg)  05/20/23 128 lb 6.4 oz (58.2 kg)  05/16/23 127 lb 6.4 oz (57.8 kg)  05/14/23 124 lb 6.4 oz (56.4 kg)  05/10/23 126 lb 9.6 oz (57.4 kg)  05/08/23 126 lb 12.8 oz (57.5 kg)  04/22/23 129 lb (58.5 kg)  04/11/23 129 lb (58.5 kg)  01/31/23 125 lb 9.6 oz (57 kg)  07/06/22 126 lb 6.4 oz (57.3 kg)   Constitutional: thin, in NAD Eyes: EOMI, no exophthalmos ENT: no neck masses, thyroidectomy scar healed; no cervical lymphadenopathy Cardiovascular: Tachycardia, RR, No MRG Respiratory: CTA B Musculoskeletal: no deformities Skin: no rashes Neurological: + tremor with outstretched hands  ASSESSMENT: 1.  Postsurgical hypothyroidism  2. S/p total thyroidectomy  3.  History of Graves' disease  4.  High serum glucose  PLAN:  1. Patient with history of uncontrolled Graves' disease, with intolerance to methimazole due to rash, now status post thyroidectomy but with uncontrolled hypothyroidism postoperatively. -Her TSH levels was very elevated, at 183 in 10/2021, checked by cardiology.  At that time, she was out of LT4.  She had increased fatigue, chest pain, palpitations.  Her weight improved afterwards and TFTs normalized.  However, at last visit, TSH was again high, at 22 after missing doses.  We increased the dose of levothyroxine and discussed about the need to take it consistently. - latest thyroid labs reviewed with pt. >> normal: Lab Results  Component Value Date   TSH 1.64 01/31/2023  - she continues on LT4 150 mcg daily - she again has thyrotoxic signs or symptoms including weight loss, tremors, palpitations, insomnia, heat intolerance and I suspect that her LT4 dose is too high, now that she is taking it correctly.  I advised her to hold it tomorrow until I can give her the results from today. - we discussed about taking the thyroid hormone every day, with water, >30 minutes before breakfast, separated by >4 hours from  acid reflux medications, calcium, iron, multivitamins. Pt. is taking it correctly. - will check thyroid tests today: TSH and fT4 - If labs are abnormal, she will need to return for repeat TFTs in 1.5 months - OTW, I will see her back in 6 months, but in 3 months for labs for labs  2. S/p total thyroidectomy -Her thyroidectomy scar has healed with very good aesthetic results.  No swelling, erythema, keloid or pain at the incision site. - She was able to come off Tums after calcium normalized - She is aware of symptoms of hypocalcemia but denies numbness, tingling, muscle cramping - Latest calcium level was normal Lab Results  Component Value Date   CALCIUM 9.3 04/11/2023   3.  History of Graves' disease -She has a history of palpitations, investigated by cardiology in the past with essentially negative results.  Her palpitations increased and was evaluated again by cardiology initially to clear her for TAH, however, now having further investigation for this.  I previously recommended to try magnesium in the evening.  She was recommended potassium  and also a beta-blocker by cardiology. - No signs of active Graves' ophthalmopathy: No double vision, eye pain, chemosis.  She had some blurry vision and dry eyes (she uses a microscope at work) >> now improved. - At last visit, TSI antibodies were still elevated:, Slightly higher than before: Lab Results  Component Value Date   TSI 175 (H) 01/31/2023   4.  High serum glucose -She has a history of gestational diabetes and also has a maternal diabetic history - Latest HbA1c was 5.5%, at goal, a year ago  NEEDS REFILLS FOR 90 days, regardless if we change the dose or not!  Component     Latest Ref Rng 08/01/2023  TSH     mIU/L 1.92   T4,Free(Direct)     0.8 - 1.8 ng/dL 2.0 (H)   Interestingly, her TSH is normal, while her T4 is high.  Patient's appointment was towards the end of the day.  This particular TFT pattern obtained at the end of the  day may be consistent with an excessive dose taken sporadically, but patient mentions that she is taking it every day.  Given that the TSH is at goal at this point, I would recommend a decrease in LT4 dose to target the higher TSH in the normal range. Plan to repeat the TFTs in 2 months.  Emilie Harden, MD PhD [facility] End[facility]

## 2023-08-01 NOTE — Patient Instructions (Addendum)
 Please continue levothyroxine 150 mcg daily.  Take the thyroid hormone every day, with water, at least 30 minutes before breakfast, separated by at least 4 hours from: - acid reflux medications - calcium - iron - multivitamins  Please stop at the lab.  Come back for another visit in 6 months but labs in 3 months.

## 2023-08-05 ENCOUNTER — Encounter: Payer: Self-pay | Admitting: Internal Medicine

## 2023-08-05 MED ORDER — LEVOTHYROXINE SODIUM 137 MCG PO TABS: 137.0000 ug | ORAL_TABLET | Freq: Every day | ORAL | 1 refills | Status: DC

## 2023-08-05 NOTE — Addendum Note (Signed)
 Addended by: Emilie Harden on: 08/05/2023 12:37 PM   Modules accepted: Orders

## 2023-10-15 ENCOUNTER — Other Ambulatory Visit

## 2023-10-15 ENCOUNTER — Encounter: Payer: Self-pay | Admitting: Internal Medicine

## 2023-10-15 LAB — T4, FREE: Free T4: 1.6 ng/dL (ref 0.8–1.8)

## 2023-10-15 LAB — TSH: TSH: 4.29 m[IU]/L

## 2023-10-16 ENCOUNTER — Ambulatory Visit: Payer: Self-pay | Admitting: Internal Medicine

## 2023-10-31 ENCOUNTER — Other Ambulatory Visit

## 2023-12-20 ENCOUNTER — Encounter (HOSPITAL_COMMUNITY): Payer: Self-pay | Admitting: Obstetrics and Gynecology

## 2023-12-24 ENCOUNTER — Encounter (HOSPITAL_COMMUNITY): Payer: Self-pay | Admitting: Obstetrics and Gynecology

## 2023-12-24 NOTE — Progress Notes (Addendum)
 Addendum:  Received pt's 12 lead EKG from [facility] cardi[facility] via fax, printed placed in chart.  Spoke w/ via phone for pre-op interview--- pt Lab needs dos----    upt,  if EKG does come in from [facility] will need dos     Lab results------ lab appt 12-25-2023 @ 1100 getting CBC/ T&S COVID test -----patient states asymptomatic no test needed Arrive at ------- 0700 on 12-26-2023 NPO after MN NO Solid Food.  Clear liquids from MN until--- 0600 Pre-Surgery Ensure or G2: n/a  Med rec completed Medications to take morning of surgery ----- toprol, synthroid , klor-con, ativan Diabetic medication ----- n/a  GLP1 agonist last dose: n/a GLP1 instructions:  Patient instructed no nail polish to be worn day of surgery Patient instructed to bring photo id and insurance card day of surgery Patient aware to have Driver (ride ) / caregiver    for 24 hours after surgery - mother, Brandy Hansen Patient Special Instructions ----- will pick up soap and written instructions at lab appt Pre-Op special Instructions ----- pt has cardiac clearance from Dr JUDITHANN Juba and EP cardiology Dr KANDICE. Wait in epic / chart.  Patient verbalized understanding of instructions that were given at this phone interview. Patient denies chest pain, sob, fever, cough at the interview.    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   PCP:  Brandy Sensor NP graciela 11-25-2022) Cardiologist : Dr JUDITHANN Juba (lov 10-03-2023) EP cardiology:  Dr KANDICE. Waits (lov 11-07-2023)  clearance note dated 11-22-2023  to proceed w/ surgery (tele)   All  results in care everywhere Chest x-ray : CT 11-25-2019 EKG :  11-07-2023 ( no tracing,  sent in request via fax, as STAT/ URGENT on 12-20-2023) Echo :  08-09-2023 Stress test:  no CTC:  08-15-2023 Cardiac Cath : no Event monitor: 09-12-2023  Activity level:   today pt denies no sob w/ any activity Sleep Study/ CPAP :  no  Checks Blood Sugar -- times a day:  n/a  Blood Thinner/ Instructions /Last Dose: no ASA / Instructions/ Last Dose :  no

## 2023-12-24 NOTE — Pre-Procedure Instructions (Signed)
 Surgical Instructions   Your procedure is scheduled on :  Thursday,  12-26-2023. Report to [facility] Main Entrance A at 7:00  A.M., then check in with the Admitting office. Any questions or running late day of surgery: call [phone]  Questions prior to your surgery date: call [phone], Monday-Friday, 8am-4pm. If you experience any cold or flu symptoms such as cough, fever, chills, shortness of breath, etc. between now and your scheduled surgery, please notify your surgeon office.    Remember:  Do not eat any food after midnight the night before your surgery.  You may have clear liquids from midnight night before surgery until 6:00 AM.   Clear liquids allowed are:  Water Carbonated Beverages Clear Tea (no milk, honey, etc.) Black Coffee Only (NO MILK, CREAM OR POWDERED CREAMER of any kind) Sport drinks, like Gatorade.  NO clear liquids after 6:00 AM day of surgery.  This includes no water,  candy,  gum,  and  mints.    Take these medicines the morning of surgery with A SIPS OF WATER : Lorazepam (ativan) Metoprolol (toprol) Potassium chloride (klor-con) Levothyroxine  (synthroid )   May take these medicines IF NEEDED:  none    One week prior to surgery, STOP taking any Aspirin (unless otherwise instructed by your surgeon) Aleve, Naproxen, Ibuprofen , Motrin , Advil , Goody's, BC's, all herbal medications, fish oil, and non-prescription vitamins.                     Do NOT Smoke (Tobacco/Vaping) and Do Not drink alcohol for 24 hours prior to your procedure.  If you use a CPAP at night, you may bring your mask/headgear for your overnight stay.   You will be asked to remove any contacts, glasses, piercing's, hearing aid's, dentures/partials prior to surgery. Please bring cases/ contact solution/ etc.,  for these items day of surgery.   Patients discharged the day of surgery will not be allowed to drive home, and someone needs to stay with them for 24 hours.  SURGICAL  WAITING ROOM VISITATION Patients may have no more than 2 support people in the waiting area - these visitors may rotate.   Pre-op nurse will coordinate an appropriate time for 1 ADULT support person, who may not rotate, to accompany patient in pre-op.  Children under the age of 78 must have an adult with them who is not the patient and must remain in the main waiting area with an adult.  If the patient needs to stay at the hospital during part of their recovery, the visitor guidelines for inpatient rooms apply.  Please refer to the [facility] website for the visitor guidelines for any additional information.   If you received a COVID test during your pre-op visit  it is requested that you wear a mask when out in public, stay away from anyone that may not be feeling well and notify your surgeon if you develop symptoms. If you have been in contact with anyone that has tested positive in the last 10 days please notify you surgeon.      Pre-operative CHG Bathing Instructions   You can play a key role in reducing the risk of infection after surgery. Your skin needs to be as free of germs as possible. You can reduce the number of germs on your skin by washing with CHG (chlorhexidine  gluconate) soap before surgery. CHG is an antiseptic soap that kills germs and continues to kill germs even after washing.   DO NOT use if you  have an allergy to chlorhexidine /CHG or antibacterial soaps. If your skin becomes reddened or irritated, stop using the CHG and notify one of our RN day of surgery.             TAKE A SHOWER THE NIGHT BEFORE SURGERY AND THE DAY OF SURGERY    Please keep in mind the following:  DO NOT shave, including legs and genital area, 48 hours prior to surgery.   You may shave your face before/day of surgery.  Place clean sheets on your bed the night before surgery Use a clean washcloth (not used since being washed) for each shower. DO NOT sleep with pet's night before surgery.  CHG  Shower Instructions:  Wash your face and private area with normal soap. If you choose to wash your hair, wash first with your normal shampoo.  After you use shampoo/soap, rinse your hair and body thoroughly to remove shampoo/soap residue.  Turn the water OFF and apply half the bottle of CHG soap to a CLEAN washcloth.  Apply CHG soap ONLY FROM YOUR NECK DOWN TO YOUR TOES (washing for 3-5 minutes)  DO NOT use CHG soap on face, private areas, open wounds, or sores.  Pay special attention to the area where your surgery is being performed.  If you are having back surgery, having someone wash your back for you may be helpful. Wait 2 minutes after CHG soap is applied, then you may rinse off the CHG soap.  Pat dry with a clean towel  Put on clean pajamas    Additional instructions for the day of surgery: DO NOT APPLY any lotions,  powder,  oils,  deodorants (may use underarm deodorant) , cologne/  perfumes  or makeup Do not wear jewelry /  piercing's/ metal/  permanent jewelry be removed prior to arrival day of surgery.  (No plastic piercing) Do not wear nail polish, gel polish, artificial nails, or any other type of covering on natural finger nails (toe nails are okay) Do not bring valuables to the hospital. [facility] is not responsible for valuables/personal belongings. Put on clean/comfortable clothes.  Please brush your teeth.  Ask your nurse before applying any prescription medications to the skin.

## 2023-12-25 ENCOUNTER — Encounter (HOSPITAL_COMMUNITY)
Admission: RE | Admit: 2023-12-25 | Discharge: 2023-12-25 | Disposition: A | Discharge status: Home / Self Care | Source: Ambulatory Visit | Attending: Family Medicine | Admitting: Family Medicine

## 2023-12-25 DIAGNOSIS — Z01812 Encounter for preprocedural laboratory examination: Secondary | ICD-10-CM | POA: Diagnosis present

## 2023-12-25 LAB — CBC
HCT: 41.1 % (ref 36.0–46.0)
Hemoglobin: 13.6 g/dL (ref 12.0–15.0)
MCH: 30.5 pg (ref 26.0–34.0)
MCHC: 33.1 g/dL (ref 30.0–36.0)
MCV: 92.2 fL (ref 80.0–100.0)
Platelets: 364 K/uL (ref 150–400)
RBC: 4.46 MIL/uL (ref 3.87–5.11)
RDW: 12.8 % (ref 11.5–15.5)
WBC: 7.7 K/uL (ref 4.0–10.5)
nRBC: 0 % (ref 0.0–0.2)

## 2023-12-25 LAB — TYPE AND SCREEN
ABO/RH(D): O POS
Antibody Screen: NEGATIVE

## 2023-12-26 ENCOUNTER — Encounter (HOSPITAL_COMMUNITY): Admission: RE | Payer: Self-pay | Source: Home / Self Care

## 2023-12-26 ENCOUNTER — Ambulatory Visit (HOSPITAL_COMMUNITY): Admission: RE | Admit: 2023-12-26 | Source: Home / Self Care | Admitting: Obstetrics and Gynecology

## 2023-12-26 DIAGNOSIS — Z01818 Encounter for other preprocedural examination: Secondary | ICD-10-CM

## 2023-12-26 HISTORY — DX: Personal history of gestational diabetes: Z86.32

## 2023-12-26 HISTORY — DX: Urge incontinence: N39.41

## 2023-12-26 HISTORY — DX: Personal history of other complications of pregnancy, childbirth and the puerperium: Z87.59

## 2023-12-26 HISTORY — DX: Other forms of dyspnea: R06.09

## 2023-12-26 HISTORY — DX: Iron deficiency anemia secondary to blood loss (chronic): D50.0

## 2023-12-26 HISTORY — DX: Anxiety disorder, unspecified: F41.9

## 2023-12-26 HISTORY — DX: Presence of dental prosthetic device (complete) (partial): Z97.2

## 2023-12-26 HISTORY — DX: Atrioventricular block, second degree: I44.1

## 2023-12-26 HISTORY — DX: Excessive and frequent menstruation with regular cycle: N92.0

## 2023-12-26 HISTORY — DX: Other ventricular tachycardia: I47.29

## 2023-12-26 HISTORY — DX: Personal history of other diseases of urinary system: Z87.448

## 2023-12-26 HISTORY — DX: Presence of spectacles and contact lenses: Z97.3

## 2023-12-26 HISTORY — DX: Syncope and collapse: R55

## 2023-12-26 SURGERY — HYSTERECTOMY, TOTAL, LAPAROSCOPIC, WITH SALPINGECTOMY
Anesthesia: General

## 2024-01-26 ENCOUNTER — Other Ambulatory Visit: Payer: Self-pay | Admitting: Internal Medicine

## 2024-01-26 DIAGNOSIS — E890 Postprocedural hypothyroidism: Secondary | ICD-10-CM

## 2024-01-31 ENCOUNTER — Ambulatory Visit (INDEPENDENT_AMBULATORY_CARE_PROVIDER_SITE_OTHER): Admitting: Internal Medicine

## 2024-01-31 ENCOUNTER — Encounter: Payer: Self-pay | Admitting: Internal Medicine

## 2024-01-31 ENCOUNTER — Other Ambulatory Visit

## 2024-01-31 VITALS — BP 120/60 | HR 70 | Ht 67.0 in | Wt 121.8 lb

## 2024-01-31 DIAGNOSIS — Z8639 Personal history of other endocrine, nutritional and metabolic disease: Secondary | ICD-10-CM | POA: Diagnosis not present

## 2024-01-31 DIAGNOSIS — E890 Postprocedural hypothyroidism: Secondary | ICD-10-CM

## 2024-01-31 DIAGNOSIS — Z9889 Other specified postprocedural states: Secondary | ICD-10-CM | POA: Diagnosis not present

## 2024-01-31 DIAGNOSIS — Z9089 Acquired absence of other organs: Secondary | ICD-10-CM

## 2024-01-31 LAB — T4, FREE: Free T4: 1.6 ng/dL (ref 0.8–1.8)

## 2024-01-31 LAB — TSH: TSH: 3.08 m[IU]/L

## 2024-01-31 NOTE — Patient Instructions (Signed)
 Please continue levothyroxine  137 mcg daily.  Take the thyroid  hormone every day, with water, at least 30 minutes before breakfast, separated by at least 4 hours from: - acid reflux medications - calcium  - iron  - multivitamins  Please stop at the lab.  Come back for another visit in 6 months but labs in 3 months.

## 2024-01-31 NOTE — Progress Notes (Addendum)
 Patient ID: Brandy Hansen, female   DOB: [birth year], 38 y.o.   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  Last visit 6 mo ago.  Interim history: At last visit she mentioned the return of tremors, heat intolerance palpitations, weight loss, insomnia (sleeping  approximately 2 hours at night).  She had increased stress as she is also taking care of her children and she was a caregiver for her parents and has to keep up with their appointments, also.  She also had palpitations and was evaluated by cardiology.  She was supposed to have a hysterectomy but this was on hold.  This is planned for next month. Today's visit, she mentions that she is feeling much better.  Since last visit, we reduced the dose of levothyroxine , but she was also able to start on Prozac and Ativan.  She is still not sleeping well at night, but at least during the day she feels better.  Reviewed and addended history: Patient describes that for approximately a year (2020), she has been feeling more hyperactive, lost almost 30 lbs, has heat intolerance, tremors, itching, SOB, palpitations.  These symptoms worsened fall 2020.  She finally presented to see her PCP on 04/29/2019 and thyroid  tests showed thyrotoxicosis.  She was immediately referred to endocrinology.  On 05/11/2019, we diagnosed Graves' disease based on elevated TSI antibodies and started her on methimazole  10 mg bid.  However, she developed a rash with methimazole  and we had to switch to PTU 50 mg tid in 06/2019.  Since last visit, she had total thyroidectomy by Dr. Eletha on 10/02/2019.  She was much better after the surgery with only some fatigue.  Calcium  was normal >> we stopped her Tums: 11/16/2021: CALCIUM  8.7 - 10.2 mg/dL 9.7   Total Protein 6.0 - 8.5 g/dL 8.1   Albumin, Serum 3.9 - 4.9 g/dL 5.3 High     Lab Results   Component Value Date   CALCIUM  9.3 04/11/2023    We only started levothyroxine  on 10/16/2019.  In 12/2020 I advised her to increase the dose from 125 to 137 mcg daily as her TSH was 19.32.  She did not come for repeat TFTs afterwards.   On 11/16/2021, she saw cardiology for chest pain, palpitations and also mentioned a weight loss of 20 pounds in the previous year.  At that time, TSH was 183.  She was off LT4 for 2 months at that time.  She ran out of LT4  2 days prior to our 06/2022 appointment and was using a lower dose, 112 mcg for the previous 2 days.  I refilled her 137 mcg prescription.  In 08/2022, we increased the dose to 150 mcg daily.  In 07/2023, we decreased the dose to 137 mcg daily.  She is currently taking LT4 137 mcg daily: - in am - fasting - at least 30 min from b'fast >> skips; only eats 1 meal a day -  no calcium , MVI, PPIs - prev. + Fe at night >> stopped - not on Biotin  I reviewed her TFTs: Lab Results  Component Value Date   TSH 4.29 10/15/2023   TSH 1.92 08/01/2023   TSH 1.64 01/31/2023   TSH 16.90 (H) 08/28/2022   TSH 22.36 (H) 07/06/2022   TSH 4.99 01/03/2022   TSH 19.32 (H) 12/28/2020   TSH 3.37 04/27/2020   TSH 23.37 (H) 11/11/2019  TSH <0.01 (L) 08/18/2019   FREET4 1.6 10/15/2023   FREET4 2.0 (H) 08/01/2023   FREET4 1.16 01/31/2023   FREET4 0.98 08/28/2022   FREET4 1.04 07/06/2022   FREET4 1.43 01/03/2022   FREET4 0.93 12/28/2020   FREET4 1.04 04/27/2020   FREET4 1.07 11/11/2019   FREET4 2.01 (H) 08/18/2019   T3FREE 4.7 (H) 08/18/2019   T3FREE 3.9 07/14/2019   T3FREE 5.2 (H) 06/09/2019   T3FREE 13.0 (H) 05/11/2019  11/16/2021: TSH 183, fT4 0.45  Component     Latest Ref Rng & Units 04/29/2019  TSH     0.450 - 4.500 uIU/mL <0.005 (L)  Thyroxine (T4)     4.5 - 12.0 ug/dL >75.0 (HH)  T3 Uptake Ratio     24 - 39 % 56 (H)  Free Thyroxine Index     1.2 - 4.9 >13.9 (H)   Reviewed her Graves' antibodies: Lab Results  Component Value  Date   TSI 175 (H) 01/31/2023   TSI 137 01/03/2022   TSI 178 (H) 12/28/2020   TSI 145 (H) 05/11/2019  04/29/2019: ATA antibodies <1, TPO antibodies 355 (0-34)  She previously mentioned: Fatigue, insomnia (chronic-on Seroquel ), heat intolerance, tremors, anxiety, palpitations, hyper defecation, weight loss, easy bruising.  These resolved after her total thyroidectomy, but recurred afterwards.  Pt does not have a FH of thyroid  ds. No FH of thyroid  cancer. No h/o radiation tx to head or neck. No herbal supplements. No Biotin use. No recent steroids use.   Mother with DM1.  Reviewing her chart, I noticed that she had some higher blood sugars: Lab Results  Component Value Date   GLUCOSE 97 04/11/2023   GLUCOSE 121 (H) 03/26/2022   GLUCOSE 132 (H) 10/03/2019   GLUCOSE 118 (H) 09/29/2019   GLUCOSE 86 04/29/2019   GLUCOSE 75 07/02/2018   GLUCOSE 130 (H) 08/20/2016   GLUCOSE 70 07/21/2016   GLUCOSE 100 (H) 12/18/2015   GLUCOSE 103 (H) 06/24/2011   GLUCOSE 95 06/18/2011   GLUCOSE 86 07/18/2009   HbA1c reviewed: Lab Results  Component Value Date   HGBA1C 5.5 07/06/2022   She has a history of gestational diabetes. Her mother has a history of diabetes, diagnosed in her 7s.  She also has a history of anemia with Hb of 6.6!  She had a syncopal episode 03/2022 while on the commode - possible vaso-vagal syncope.  Lipids: 07/29/2023: 150/81/58/77.  Last pregnancy 07/2018.  She lost 2 previous pregnancies.  ROS: + See HPI  I reviewed pt's medications, allergies, PMH, social hx, family hx, and changes were documented in the history of present illness. Otherwise, unchanged from my initial visit note.  Past Medical History:  Diagnosis Date   Anxiety disorder    Arthritis    knees   Bipolar 1 disorder (HCC)    Depression    DOE (dyspnea on exertion)    chronic  --  12-24-2023  pt stated this has very much improved after being on toprol , which has improved symptoms   History of  bladder disorder    ? problems as child, bladderstretched fine since   History of cervical dysplasia 2014   s/p  LEEP in GYN office;   CIN 2--3   History of ectopic pregnancy 2013   s/p right salpingectomy w/ removal ectopic preg.   History of gestational diabetes    History of Graves' disease 2021   dx thyrotoxicosis, uncontrolled on medication s/p  total thyroidectomy 06/ 2021   History of pregnancy  induced hypertension    Hypertension    Hypothyroidism, postsurgical 09/2019   endocrinologist--- dr trixie;  hx uncontrolled graves dz;  06/ 2021  s/p total thyroidectomy   Iron  deficiency anemia due to chronic blood loss    hematologist---- dr a. dothard ([facility];  treated w/ iron  infusions;   x5 01/ 2025;  x1  11-22-2023   Menorrhagia    Mobitz type 1 second degree AV block    cardiologist-- dr g. saliashvili ([facility]);  evaluation for pre-op d/t atypical cp/palpitations/ recurrent syncope/ near syncope;  ZIO 09-12-2023 NSVT w/ transient second degree HB type 1,  normal echo ef 55-60% 04/ 2025,  CTC w/ calcium  score zero 04/ 2025 (results in care everywhere;  pt seen by EP Dr Margarite 11-07-2023 put on toprol , possibly due to IDA can not rule out IST, to follow up 3 months   NSVT (nonsustained ventricular tachycardia) (HCC)    PONV (postoperative nausea and vomiting)    Recurrent syncope    12-24-2023  pt stated last episode approx 07-29-2023   Urge urinary incontinence    Wears glasses    Wears partial dentures    lower   Past Surgical History:  Procedure Laterality Date   CHONDROPLASTY Right 10/27/2014   Procedure: CHONDROPLASTY;  Surgeon: Norleen Gavel, MD;  Location: [facility];  Service: Orthopedics;  Laterality: Right;   COLONOSCOPY WITH PROPOFOL   04/22/2023   Gordy Starch at [facility]   CYSTOSCOPY  1986   per pt bladder stretched (hydrodistention)   DILATION AND EVACUATION N/A 08/20/2016   Procedure: DILATATION AND EVACUATION;  Surgeon: Gigi Botts, MD;   Location: [facility];  Service: Gynecology;  Laterality: N/A;   KNEE ARTHROSCOPY Right 10/27/2014   Procedure: ARTHROSCOPY KNEE with chondroplasty of medial femeral condyle and femoral patella joint;  Surgeon: Norleen Gavel, MD;  Location: [facility];  Service: Orthopedics;  Laterality: Right;   LAPAROSCOPY  06/24/2011   Procedure: LAPAROSCOPY OPERATIVE;  Surgeon: Burnard HILARIO Pate, MD;  Location: [facility];  Service: Gynecology;  Laterality: Right;  operative laparoscopy and right salpingectomy with removal of ectopic pregnancy   THYROIDECTOMY N/A 10/02/2019   Procedure: TOTAL THYROIDECTOMY;  Surgeon: Eletha Boas, MD;  Location: [facility];  Service: General;  Laterality: N/A;   WRIST ARTHROSCOPY W/ TRIANGULAR FIBROCARTILAGE REPAIR Right 10/29/2000   @[facility]  by dr g. murrell;  removal cyst   Social History   Socioeconomic History   Marital status: Married    Spouse name: Not on file   Number of children: 2: 9 years and 9 months in 04/2019   Years of education: Not on file   Highest education level: Not on file  Occupational History    Varies diet [  Tobacco Use   Smoking status: Former Smoker, now vapes    Types: Cigarettes    Quit date: 11/23/2017    Years since quitting: 1.4   Smokeless tobacco: Never Used  Substance and Sexual Activity   Alcohol use:     Comment: rarely, beer   Drug use: No   Social Determinants of Corporate investment banker Strain:    Difficulty of Paying Living Expenses: Not on file  Food Insecurity:    Worried About Programme researcher, broadcasting/film/video in the Last Year: Not on file   [employer] of Food in the Last Year: Not on file  Transportation Needs:    Lack of Transportation (Medical): Not on file   Lack of Transportation (Non-Medical):  Not on file  Physical Activity:    Days of Exercise per Week: Not on file   Minutes of Exercise per Session: Not on file  Stress:    Feeling of Stress : Not on file  Social Connections:    Frequency of Communication with Friends and  Family: Not on file   Frequency of Social Gatherings with Friends and Family: Not on file   Attends Religious Services: Not on file   Active Member of Clubs or Organizations: Not on file   Attends Banker Meetings: Not on file   Marital Status: Not on file  Intimate Partner Violence:    Fear of Current or Ex-Partner: Not on file   Emotionally Abused: Not on file   Physically Abused: Not on file   Sexually Abused: Not on file   Current Outpatient Medications on File Prior to Visit  Medication Sig Dispense Refill   FLUoxetine (PROZAC) 10 MG tablet Take 10 mg by mouth at bedtime.     levothyroxine  (SYNTHROID ) 125 MCG tablet Take 125 mcg by mouth daily before breakfast.     levothyroxine  (SYNTHROID ) 137 MCG tablet Take 1 tablet (137 mcg total) by mouth daily before breakfast 30 tablet 0   LORazepam (ATIVAN) 0.5 MG tablet Take 0.5 mg by mouth 2 (two) times daily.     metoprolol succinate (TOPROL-XL) 25 MG 24 hr tablet Take 25 mg by mouth daily.     potassium chloride (KLOR-CON M) 10 MEQ tablet Take 10 mEq by mouth 3 (three) times a week.     No current facility-administered medications on file prior to visit.   Allergies  Allergen Reactions   Codeine Itching   Methimazole  Rash   Naproxen Nausea And Vomiting and Rash   Sulfa Antibiotics Rash   Family History  Problem Relation Age of Onset   Diabetes Mother    Breast cancer Mother 10   Hypertension Father    Hyperlipidemia Father    Prostate cancer Father    Stomach cancer Paternal Uncle    Cancer Maternal Grandmother    Hyperlipidemia Maternal Grandmother    Cancer Maternal Grandfather    Cancer Paternal Grandmother    Colon cancer Paternal Grandfather    Cancer Paternal Grandfather    Stroke Paternal Grandfather    Anesthesia problems Neg Hx    Esophageal cancer Neg Hx    Colon polyps Neg Hx    Rectal cancer Neg Hx    PE: BP 120/60   Pulse 70   Ht 5' 7 (1.702 m)   Wt 121 lb 12.8 oz (55.2 kg)   SpO2 99%    BMI 19.08 kg/m  Wt Readings from Last 10 Encounters:  01/31/24 121 lb 12.8 oz (55.2 kg)  08/01/23 117 lb 6.4 oz (53.3 kg)  05/20/23 128 lb 6.4 oz (58.2 kg)  05/16/23 127 lb 6.4 oz (57.8 kg)  05/14/23 124 lb 6.4 oz (56.4 kg)  05/10/23 126 lb 9.6 oz (57.4 kg)  05/08/23 126 lb 12.8 oz (57.5 kg)  04/22/23 129 lb (58.5 kg)  04/11/23 129 lb (58.5 kg)  01/31/23 125 lb 9.6 oz (57 kg)   Constitutional: thin, in NAD Eyes: EOMI, no exophthalmos ENT: no neck masses, thyroidectomy scar healed; no cervical lymphadenopathy Cardiovascular: Tachycardia, RR, No MRG Respiratory: CTA B Musculoskeletal: no deformities Skin: no rashes Neurological: + tremor with outstretched hands  ASSESSMENT: 1.  Postsurgical hypothyroidism  2. S/p total thyroidectomy  3.  History of Graves' disease  4.  High  serum glucose - Latest HbA1c was normal: Lab Results  Component Value Date   HGBA1C 5.5 07/06/2022   PLAN:  1. Patient with history of uncontrolled Graves' disease, with intolerance to methimazole  due to rash, now status post thyroidectomy but with uncontrolled hypothyroidism postoperatively -Her TSH levels was very elevated, at 183 in 10/2021, checked by cardiology.  At that time, she was out of LT4.  She had increased fatigue, chest pain, palpitations.  Her weight improved afterwards and TFTs normalized.  However, at last visit, TSH was again high, at 22 after missing doses.  We increased the dose of levothyroxine  and discussed about the need to take it consistently. - latest thyroid  labs reviewed with pt. >> normal: Lab Results  Component Value Date   TSH 4.29 10/15/2023  - she continues on LT4 137 mcg daily - at last visit, she had again thyrotoxic signs and symptoms including weight loss, tremors, palpitations, insomnia, heat intolerance but her TFTs were normal.  We still reduced the dose of levothyroxine  due to her symptoms.  A subsequent TSH was slightly higher (see above), but still normal.  At  today's visit, she feels much better.  This is more likely to be related to starting Prozac and Ativan, but the decreasing levothyroxine  dose may also have helped. - we discussed about taking the thyroid  hormone every day, with water, >30 minutes before breakfast, separated by >4 hours from acid reflux medications, calcium , iron , multivitamins. Pt. is taking it correctly. - will check thyroid  tests today: TSH and fT4 - If labs are abnormal, she will need to return for repeat TFTs in 1.5 months  2. S/p total thyroidectomy -Her thyroidectomy scar has healed with very good aesthetic results.  No swelling, erythema, keloid or pain at the incision site. - She was able to come off times after her calcium  normalized - She is aware of symptoms of hypocalcemia but denies numbness, tingling, muscle cramping - Latest calcium  level was normal: Lab Results  Component Value Date   CALCIUM  9.3 04/11/2023   3.  History of Graves' disease -She has a history of palpitations, investigated by cardiology in the past with essentially negative results.  Her palpitations increased and was evaluated again by cardiology initially to clear 48, however, then had further investigation for this.  She was  recommended to start potassium and also beta-blocker by cardiology. -No active signs of Graves' ophthalmopathy, no double vision, blurry vision, eye pain, chemosis.  She has dry eyes. She uses a microscope at work. -At last check, her TSI's were still elevated, at 175 a year ago.   4.  High serum glucose -She has a history of gestational diabetes and also has a maternal diabetic history - Latest HbA1c was 5.5%, at goal, a year ago  NEEDS REFILLS FOR 90 days, regardless if we change the dose or not!  Component     Latest Ref Rng 01/31/2024  TSH     mIU/L 3.08   T4,Free(Direct)     0.8 - 1.8 ng/dL 1.6   Normal TFTs.  Lela Fendt, MD PhD [facility]

## 2024-02-03 ENCOUNTER — Ambulatory Visit: Payer: Self-pay | Admitting: Internal Medicine

## 2024-02-03 MED ORDER — LEVOTHYROXINE SODIUM 137 MCG PO TABS: 137.0000 ug | ORAL_TABLET | Freq: Every day | ORAL | 3 refills | Status: AC

## 2024-02-03 NOTE — Addendum Note (Signed)
 Addended by: TRIXIE FILE on: 02/03/2024 12:47 PM   Modules accepted: Orders

## 2024-03-03 ENCOUNTER — Encounter (HOSPITAL_COMMUNITY): Payer: Self-pay | Admitting: Obstetrics and Gynecology

## 2024-03-03 NOTE — Progress Notes (Signed)
 Surgical Instructions  Your procedure is scheduled on :   Thursday,  03-12-2024 Report to [facility] Main Entrance A at 6:30 AM, then check in the Admitting office. Any questions or running late day of surgery :  call [phone]  Questions prior to your surgery day:  call [phone], Monday -- Friday 8am - 4pm. If you experience any cold or flu symptoms such as cough, fever, chills, shortness of breath, etc. between now and you scheduled surgery, please notify your surgeon office.   Remember: Do not eat any food after midnight the night before surgery. You may have clear liquids from midnight night before surgery until 5:30 AM.   Clear liquids allowed are:  Water             Carbonated Beverages (diabetics choose diet or no sugar options)  Clear Tea ( no milk, no honey, etc.)  Black coffee ( NO MILK, CREAM OR POWDERED CREAMER OF ANY KIND)  Sport drinks, like Gatorade (diabetes choose diet or no sugar options)  NO clear liquid after 5:30 AM day of surgery.  This includes No water,  candy,  gum, and mints.  Take these medicines the morning of surgery with A SIPS OF WATER:   Levothyroxine  (synthroid )  May take these medicines IF NEEDED:    NONE   One week prior to surgery, STOP taking any Aspirin (unless otherwise instructed by your surgeon) Aleve, Naproxen, ibuprofen , Motrin , Advil , Goody's, BC's, all herbal medications/ supplements, fish oil, and non-prescription vitamins.  Do NOT Smoke (tobacco/ vaping) and Do Not drink alcohol for 24 hours prior to your procedure.  For those patients that use a CPAP.  Please bring your CPAP/ mask/ tubing with them day of surgery . Anesthesia may ask recovery room nurse to use and if you stay the night you be asked to use it.  You will be asked to removed any contacts, glasses, piercing's, hearing aid's, dentures/ partials prior to surgery.  Please bring cases/ container/ solution/ etc., for them day of surgery.   Patients discharged the day of  surgery will NOT be allowed to drive home.  You must have responsible driver and caregiver to stay at home with you the next 24 hours.  SURGICAL WAITING ROOM VISITATION Patients may have no more than 2 support people in the waiting area - if more than 2 , these visitors may rotate.  Pre-op nurse will coordinate an appropriate time for 1 Adult support person, who may not rotate, to accompany patient in pre-op.  Aware some patients may have certain circumstances, speak to pre-op nurse day of surgery.  Children under the age 25 must have an adult with them who is not the patient and must remain in the main waiting area with an adult.  If the patient needs to stay at the hospital during part of their recovery, the visitor guidelines for inpatient rooms apply.  Please refer to the [facility] website for the visitor guidelines for any additional information.  If you received a COVID test during your pre-op visit it is requested that you wear a mask when out in public, stay away from anyone that may not be feeling well and notify your surgeon if you develop symptoms.  If you have been in contact with anyone that has tested positive in the past 10 days notify your surgeon.     [place] - Preparing for Surgery  Before surgery, you can play an important role. Because skin is not sterile, it needs  to be as free of germs as possible. You can reduce the number of germs on your skin by washing with CHG (chlorhexidine  gluconate) soap before surgery. CHG is an antiseptic cleaner which kills germs and bonds with the skin to continue killing germs even after washing. Oral hygiene is also important in reducing the risk of infection. Remember to brush your teeth with your regular toothpaste the morning of surgery.  Please DO NOT use if you have an allergy to CHG or antibacterial soaps. If your skin becomes reddened/irritated stop using the CHG and inform your Pre-op nurse day of surgery.  DO NOT shave (including  legs and genital area) for at least 48 hours prior to your CHG shower.   Please follow these instructions carefully:  Shower with CHG soap the night before surgery. If you choose to wash your hair, wash your hair first as usual with your normal shampoo. After you shampoo, rinse your hair and body thoroughly to remove the shampoo. Use CHG as you would any other liquid soap. You can apply CHG directly to the skin and wash gently with a clean washcloth or shower sponge. Apply the CHG soap to your body ONLY FROM THE NECK DOWN. Do not use on open wounds or open sores. Avoid contact with your eyes, ears, mouth, and genitals (private parts). Wash genitals (private parts) with your normal soap. Wash thoroughly, paying special attention to the area where your surgery will be performed. Thoroughly rinse your body with warm water from the neck down. DO NOT shower/wash with your normal soap after using and rinsing off the CHG soap. DO NOT use lotions, oils, etc., after showering with CHG. Pat yourself dry with a clean towel. Wear clean pajamas. Place clean sheets on your bed the night of your CHG shower and do not sleep with pets.  Day of Surgery  DO NOT Apply any lotions,  powder,  oils,  deodorants (may use underarm deodorant),  cologne/  perfumes  or makeup Do Not wear jewelry /  piercing's/  metal/  permanent jewelry must be removed prior to arrival day of surgery. (No plastic piercing) Do Not wear nail polish,  gel polish,  artificial nails, or any other type of covering on natural finger nails (toe nails are okay) Remember to brush your teeth and rinse mouth out. Put on clean / comfortable clothes. [facility] is not responsible for valuables/ personal belongings

## 2024-03-03 NOTE — Progress Notes (Signed)
 Spoke w/ via phone for pre-op interview--- pt Lab needs dos----   upt     (per anes) Lab results------ lab appt 03-11-2024 @ 1300  getting CBC/ BMP/ T&S (per anes) Current 12 lead EKG with chart COVID test -----patient states asymptomatic no test needed Arrive at ------- 0630 on 03-12-2024 NPO after MN NO Solid Food.  Clear liquids from MN until--- 0530 Pre-Surgery Ensure or G2:  Med rec completed Medications to take morning of surgery ----- synthroid  Diabetic medication ----- n/a  GLP1 agonist last dose: n/a GLP1 instructions:  Patient instructed no nail polish to be worn day of surgery Patient instructed to bring photo id and insurance card day of surgery Patient aware to have Driver (ride ) / caregiver    for 24 hours after surgery - mother, mary mccormick Patient Special Instructions ----- will pick up soap and written instructions at lab appt Pre-Op special Instructions -----  sent inbox message in epic to Dr Delana on 03-02-2024, requested pre-op orders Pt has cardiac clearance from Dr JUDITHANN Juba and EP cardiology Dr KANDICE. Wait in epic/ chart  Patient verbalized understanding of instructions that were given at this phone interview. Patient denies chest pain, sob, fever, cough at the interview.    Anesthesia Review:  hx recurrent syncope/ near syncope (pt stated last episode 07-29-2023);  evaluation / work-up by cardiology/ EP dx NSVT/ transient second degree HB type 1 w/ junctional escape beats;  chronic DOE (pt states very much improved since taking toprol);   Pt today denies sob w/ any activity, no palpitations, or feeling of heart racing.  PCP:  Daved Sensor NP graciela 01-07-2024) Cardiologist : Dr JUDITHANN Juba  (lov 10-03-2023)  EP cardiology:  Dr KANDICE. Waits (lov 11-07-2023)  telephone clearance note dated 11-22-2023 to proceed w/ surgery  in epic/ chart   All results in care everywhere Chest x-ray :  CT 11-25-2019 EKG :  11-07-2023  (had requested previously when surgery  scheduled 09/ 2025 now in epic, printed copy in chart) Echo :  08-09-2023 Stress test:  no Cardiac Cath :   no Event monitor:  09-12-2023  Sleep Study/ CPAP :  no Fasting Blood Sugar :      / Checks Blood Sugar -- times a day:   n/a  Blood Thinner/ Instructions /Last Dose: no ASA / Instructions/ Last Dose : no

## 2024-03-11 ENCOUNTER — Other Ambulatory Visit (HOSPITAL_COMMUNITY)

## 2024-03-12 ENCOUNTER — Ambulatory Visit (HOSPITAL_COMMUNITY): Admission: RE | Admit: 2024-03-12 | Source: Home / Self Care | Admitting: Obstetrics and Gynecology

## 2024-03-12 ENCOUNTER — Encounter (HOSPITAL_COMMUNITY): Admission: RE | Payer: Self-pay | Source: Home / Self Care

## 2024-03-12 DIAGNOSIS — Z01818 Encounter for other preprocedural examination: Secondary | ICD-10-CM

## 2024-03-12 DIAGNOSIS — N920 Excessive and frequent menstruation with regular cycle: Secondary | ICD-10-CM

## 2024-03-12 SURGERY — HYSTERECTOMY, TOTAL, LAPAROSCOPIC, WITH SALPINGECTOMY
Anesthesia: General

## 2024-07-31 ENCOUNTER — Ambulatory Visit: Admitting: Internal Medicine
